# Patient Record
Sex: Male | Born: 1980 | Race: Black or African American | Hispanic: No | Marital: Married | Smoking: Never smoker
Health system: Southern US, Community
[De-identification: ages and names within clinical notes are randomized; demographics above are authoritative.]

## PROBLEM LIST (undated history)

## (undated) DIAGNOSIS — I10 Essential (primary) hypertension: Secondary | ICD-10-CM

## (undated) DIAGNOSIS — E119 Type 2 diabetes mellitus without complications: Secondary | ICD-10-CM

## (undated) DIAGNOSIS — Z01818 Encounter for other preprocedural examination: Secondary | ICD-10-CM

## (undated) HISTORY — PX: CHOLECYSTECTOMY: SHX55

---

## 2011-05-27 NOTE — ED Notes (Signed)
Charge nurse KS notified of Pt's BP.

## 2011-05-27 NOTE — ED Notes (Signed)
Migraine HA, N/V. Photophobia.

## 2011-05-27 NOTE — ED Provider Notes (Signed)
Patient is a 31 y.o. male presenting with migraines.   Migraine          No past medical history on file.     No past surgical history on file.      No family history on file.     History     Social History   ??? Marital Status: SINGLE     Spouse Name: N/A     Number of Children: N/A   ??? Years of Education: N/A     Occupational History   ??? Not on file.     Social History Main Topics   ??? Smoking status: Not on file   ??? Smokeless tobacco: Not on file   ??? Alcohol Use: Not on file   ??? Drug Use: Not on file   ??? Sexually Active: Not on file     Other Topics Concern   ??? Not on file     Social History Narrative   ??? No narrative on file                  ALLERGIES: Review of patient's allergies indicates no known allergies.      Review of Systems    Filed Vitals:    05/27/11 1637   BP: 192/122   Pulse: 72   Temp: 97.2 ??F (36.2 ??C)   Resp: 18   SpO2: 100%            Physical Exam     MDM    Procedures  Left without being seen

## 2013-09-29 NOTE — ED Provider Notes (Signed)
HPI Comments: 11:53 PM   33 y.o. male presents to ED complaining of epigastric pain, RUQ abdominal pain , and right-sided CVA back pain onset earlier today after eating a hamburger. Associated symptoms include nocturnal urinary frequency, nausea, and vomiting. PMHX includes HTN. Pt also reports hx of gallstones, last attack was 4 days ago where he vomited x2, went to an ED, had no imaging, was given fluids, morphine and Zofran, and was discharged with no prescriptions. Family hx includes diabetes. Patient denies fever, chills, diarrhea, dysuria, urgency, chest pain, SOB, difficulty breathing, diaphoresis, and any other symptoms or complaints       Patient is a 33 y.o. male presenting with back pain. The history is provided by the patient. No language interpreter was used.   Back Pain   This is a new problem. Episode onset: today. The problem has not changed since onset.The pain is at a severity of 10/10. Associated symptoms include abdominal pain (RUQ and epigastric). Pertinent negatives include no chest pain, no fever, no numbness, no headaches, no dysuria and no weakness. Risk factors: hx of gallstones.      Written by Einar Grad, ED Scribe, as dictated by Leamon Arnt, PA-C      Past Medical History   Diagnosis Date   ??? Gallstones    ??? Hypertension    ??? Kidney stones         History reviewed. No pertinent past surgical history.      History reviewed. No pertinent family history.     History     Social History   ??? Marital Status: SINGLE     Spouse Name: N/A     Number of Children: N/A   ??? Years of Education: N/A     Occupational History   ??? Not on file.     Social History Main Topics   ??? Smoking status: Never Smoker    ??? Smokeless tobacco: Not on file   ??? Alcohol Use: No   ??? Drug Use: Not on file   ??? Sexual Activity: Not on file     Other Topics Concern   ??? Not on file     Social History Narrative       ALLERGIES: Review of patient's allergies indicates no known allergies.      Review of Systems    Constitutional: Negative for fever, chills, activity change, appetite change and fatigue.   HENT: Negative for congestion, dental problem, ear pain, rhinorrhea, sinus pressure, sneezing, sore throat, trouble swallowing and voice change.    Eyes: Negative for pain, discharge, redness and itching.   Respiratory: Negative for cough, chest tightness, shortness of breath and wheezing.    Cardiovascular: Negative for chest pain and palpitations.   Gastrointestinal: Positive for vomiting and abdominal pain (RUQ and epigastric). Negative for nausea, diarrhea, constipation, blood in stool, abdominal distention and rectal pain.   Endocrine: Positive for polyuria.   Genitourinary: Positive for frequency (nocturnal). Negative for dysuria, hematuria, flank pain, discharge, penile pain and testicular pain.   Musculoskeletal: Positive for back pain (Right CVA region). Negative for joint swelling, arthralgias, neck pain and neck stiffness.   Skin: Negative for color change, rash and wound.   Allergic/Immunologic: Negative for immunocompromised state.   Neurological: Negative for dizziness, weakness, light-headedness, numbness and headaches.   Hematological: Negative for adenopathy.   Psychiatric/Behavioral: Negative for behavioral problems and agitation. The patient is not nervous/anxious.        Filed Vitals:    09/29/13 2345  09/30/13 0230 09/30/13 0439 09/30/13 0717   BP: 165/107 195/115 162/95 176/88   Pulse: 92 83 78 71   Temp: 98.1 ??F (36.7 ??C)      Resp: 18 16  18    Height: 6' (1.829 m)      Weight: 165.563 kg (365 lb)      SpO2: 98% 95% 98% 97%            Physical Exam   Constitutional: He is oriented to person, place, and time. He appears well-developed and well-nourished. No distress.   HENT:   Head: Normocephalic and atraumatic.   Right Ear: External ear normal.   Left Ear: External ear normal.   Nose: Nose normal.   Mouth/Throat: Oropharynx is clear and moist. No oropharyngeal exudate.    No rhinorrhea. Normal turbinates. No sinus TTP. Bilateral ear canals are clear and normal. Bilateral TM's are normal. Normal oropharynx, no erythema, or edema, tonsillar hypertrophy, or exudates.       Eyes: Conjunctivae and EOM are normal. Pupils are equal, round, and reactive to light. Right eye exhibits no discharge. Left eye exhibits no discharge.   Neck: Normal range of motion. Neck supple.   Cardiovascular: Normal rate, regular rhythm, normal heart sounds and intact distal pulses.  Exam reveals no friction rub.    No murmur heard.  Pulmonary/Chest: Effort normal and breath sounds normal. No respiratory distress. He has no wheezes. He has no rales. He exhibits no tenderness.   Abdominal: Soft. Bowel sounds are normal. He exhibits no distension and no mass. There is tenderness (RUQ, epigastric TTP, positive Murphy's sign, negative McBurney's point). There is CVA tenderness (right). There is no rebound and no guarding.   Musculoskeletal: Normal range of motion. He exhibits no edema or tenderness.   Lymphadenopathy:     He has no cervical adenopathy.   Neurological: He is alert and oriented to person, place, and time.   Skin: Skin is warm and dry. No rash noted. He is not diaphoretic.   Psychiatric: He has a normal mood and affect. His behavior is normal. Judgment and thought content normal.   Nursing note and vitals reviewed.       RESULTS:    EKG interpretation: (Preliminary)  12:30 AM   Nonspecific T wave abnormality, normal sinus rhythm, no STEMI, vent rate 85 bpm  EKG read by Leamon Arnt, PA-C     CT ABD PELV WO CONT   Final Result   IMPRESSION:  Limited assessment because of body habitus.  No hydronephrosis. No definite renal calculi though small calculi could be  present and not detected.  Heterogeneous gallbladder, gallstones or sludge could be present. Correlate with  right upper quadrant symptoms.  Hepatomegaly.        Labs Reviewed   CBC WITH AUTOMATED DIFF - Abnormal; Notable for the following:      RBC 4.32 (*)     HGB 12.9 (*)     All other components within normal limits   METABOLIC PANEL, COMPREHENSIVE - Abnormal; Notable for the following:     Potassium 3.4 (*)     CO2 34 (*)     Glucose 129 (*)     BUN/Creatinine ratio 10 (*)     ALT 143 (*)     AST 132 (*)     Albumin 3.3 (*)     All other components within normal limits   URINALYSIS W/ RFLX MICROSCOPIC - Abnormal; Notable for the following:     Protein  TRACE (*)     Urobilinogen 2.0 (*)     All other components within normal limits   LIPASE   URINE MICROSCOPIC ONLY   CARDIAC PANEL,(CK, CKMB & TROPONIN)       No results found for this or any previous visit (from the past 12 hour(s)).     MDM  Number of Diagnoses or Management Options  Diagnosis management comments: DDx: abd pain, epigastric pain, cholecystitis, cholelithiasis, choledochitis, pancreatitis, fatty liver, AMI, nausea, vomiting, pyelonephritis, cystitis, UTI.       Amount and/or Complexity of Data Reviewed  Clinical lab tests: ordered and reviewed (CBC, CMP, Lipase, Cardiac panel, Urinalysis)  Tests in the radiology section of CPT??: ordered and reviewed (CT abd pelv)  Tests in the medicine section of CPT??: ordered and reviewed (EKG)  Independent visualization of images, tracings, or specimens: yes (EKG)    Risk of Complications, Morbidity, and/or Mortality  Presenting problems: moderate  Diagnostic procedures: moderate  Management options: low    Patient Progress  Patient progress: stable       MEDICATIONS GIVEN:  Medications   ondansetron (ZOFRAN) injection 4 mg (4 mg IntraVENous Given 09/30/13 0018)   HYDROmorphone (PF) (DILAUDID) injection 1 mg (1 mg IntraVENous Given 09/30/13 0017)   0.9% sodium chloride infusion 1,000 mL (1,000 mL IntraVENous Given 09/30/13 0025)   HYDROmorphone (PF) (DILAUDID) injection 1 mg (1 mg IntraVENous Given 09/30/13 0228)   HYDROmorphone (PF) (DILAUDID) injection 1 mg (1 mg IntraVENous Given 09/30/13 0438)        Procedures     PROGRESS NOTE:  11:53 PM   Initial assessment performed.  Written by Einar Grad, ED Scribe, as dictated by Leamon Arnt, PA-C     PROGRESS NOTE:  2:53 AM   Patient care will be transferred to Josphat Musapatike, MD.  Discussed available diagnostic results and care plan at length.  Written by Einar Grad, as dictated by Leamon Arnt, PA-C .     CONSULT NOTE:   6:20 AM  Josphat Musapatike, MD spoke with Kathrynn Ducking, MD    Specialty: Surgery  Discussed pt's hx, disposition, and available diagnostic and imaging results. Reviewed care plans. Consulting physician agrees with plans as outlined. Dr. Carlynn Purl came by and gave him a card and he will see him tomorrow in the office or Wednesday.   Written by Conley Rolls, ED Scribe, as dictated by Glynn Octave, MD     PROGRESS NOTE:   7:01 AM  Pt has been re-examined by Buelah Manis, MD. Pt???s pain is resolved and will be discharged. Repeat exam shows no tenderness.   Written by Conley Rolls, ED Scribe, as dictated by Glynn Octave, MD .        DISCHARGE NOTE:  6:51 AM    Selmer Dominion  results have been reviewed with him.  He has been counseled regarding his diagnosis, treatment, and plan.  He verbally conveys understanding and agreement of the signs, symptoms, diagnosis, treatment and prognosis and additionally agrees to follow up as discussed.  He also agrees with the care-plan and conveys that all of his questions have been answered.  I have also provided discharge instructions for him that include: educational information regarding their diagnosis and treatment, and list of reasons why they would want to return to the ED prior to their follow-up appointment, should his condition change.    CLINICAL IMPRESSION:    1. Biliary colic    2. Cholelithiasis without cholecystitis  PLAN: DISCHARGE HOME    Follow-up Information    Follow up With Details Comments Contact Info    [facility] days  [address]  [place] [id number]   [place] [id number]  [phone]    [facility]  As needed, If symptoms worsen [address]  [place] [id number]  [phone]          Discharge Medication List as of 09/30/2013  7:06 AM      START taking these medications    Details   HYDROcodone-acetaminophen (NORCO) 7.5-325 mg per tablet Take 1 Tab by mouth every six (6) hours as needed for Pain. Max Daily Amount: 4 Tabs., Print, Disp-12 Tab, R-0         CONTINUE these medications which have NOT CHANGED    Details   lisinopril (PRINIVIL, ZESTRIL) 20 mg tablet Take 20 mg by mouth daily., Historical Med             Written by Einar Gradynthia A Ciccotelli, ED Scribe, as dictated by Leamon Arnteanna Krystl Wickware, PA-C .       I agree with the above documentation as written by the scribe.  Leamon Arnt-Iline Buchinger, PA-C

## 2013-09-29 NOTE — ED Notes (Signed)
Pt states " I have gallstones and i ate a burger earlier than i had a pain hit me in the back and around the right side to the middle of my stomach. I am so nauseated."

## 2013-09-30 LAB — CBC WITH AUTOMATED DIFF
ABS. BASOPHILS: 0 10*3/uL (ref 0.0–0.06)
ABS. EOSINOPHILS: 0.1 10*3/uL (ref 0.0–0.4)
ABS. LYMPHOCYTES: 2.4 10*3/uL (ref 0.9–3.6)
ABS. MONOCYTES: 0.4 10*3/uL (ref 0.05–1.2)
ABS. NEUTROPHILS: 3.6 10*3/uL (ref 1.8–8.0)
BASOPHILS: 0 % (ref 0–2)
EOSINOPHILS: 1 % (ref 0–5)
HCT: 38.9 % (ref 36.0–48.0)
HGB: 12.9 g/dL — ABNORMAL LOW (ref 13.0–16.0)
LYMPHOCYTES: 37 % (ref 21–52)
MCH: 29.9 PG (ref 24.0–34.0)
MCHC: 33.2 g/dL (ref 31.0–37.0)
MCV: 90 FL (ref 74.0–97.0)
MONOCYTES: 7 % (ref 3–10)
MPV: 10.2 FL (ref 9.2–11.8)
NEUTROPHILS: 55 % (ref 40–73)
PLATELET: 228 10*3/uL (ref 135–420)
RBC: 4.32 M/uL — ABNORMAL LOW (ref 4.70–5.50)
RDW: 14 % (ref 11.6–14.5)
WBC: 6.5 10*3/uL (ref 4.6–13.2)

## 2013-09-30 LAB — CARDIAC PANEL,(CK, CKMB & TROPONIN)
CK - MB: 1.5 ng/ml (ref 0.5–3.6)
CK-MB Index: 0.5 % (ref 0.0–4.0)
CK: 304 U/L (ref 39–308)
Troponin-I, QT: 0.02 NG/ML (ref 0.00–0.06)

## 2013-09-30 LAB — METABOLIC PANEL, COMPREHENSIVE
A-G Ratio: 0.8 (ref 0.8–1.7)
ALT (SGPT): 143 U/L — ABNORMAL HIGH (ref 16–61)
AST (SGOT): 132 U/L — ABNORMAL HIGH (ref 15–37)
Albumin: 3.3 g/dL — ABNORMAL LOW (ref 3.4–5.0)
Alk. phosphatase: 94 U/L (ref 45–117)
Anion gap: 5 mmol/L (ref 3.0–18)
BUN/Creatinine ratio: 10 — ABNORMAL LOW (ref 12–20)
BUN: 12 MG/DL (ref 7.0–18)
Bilirubin, total: 0.8 MG/DL (ref 0.2–1.0)
CO2: 34 mmol/L — ABNORMAL HIGH (ref 21–32)
Calcium: 8.6 MG/DL (ref 8.5–10.1)
Chloride: 104 mmol/L (ref 100–108)
Creatinine: 1.23 MG/DL (ref 0.6–1.3)
GFR est AA: >60 mL/min/{1.73_m2} (ref >60)
GFR est non-AA: >60 mL/min/{1.73_m2} (ref >60)
Globulin: 3.9 g/dL (ref 2.0–4.0)
Glucose: 129 mg/dL — ABNORMAL HIGH (ref 74–99)
Potassium: 3.4 mmol/L — ABNORMAL LOW (ref 3.5–5.5)
Protein, total: 7.2 g/dL (ref 6.4–8.2)
Sodium: 143 mmol/L (ref 136–145)

## 2013-09-30 LAB — URINE MICROSCOPIC ONLY
Bacteria: NEGATIVE /hpf
RBC: NEGATIVE /hpf (ref 0–5)
WBC: 1 /hpf (ref 0–5)

## 2013-09-30 LAB — URINALYSIS W/ RFLX MICROSCOPIC
Bilirubin: NEGATIVE
Blood: NEGATIVE
Glucose: NEGATIVE mg/dL
Ketone: NEGATIVE mg/dL
Leukocyte Esterase: NEGATIVE
Nitrites: NEGATIVE
Specific gravity: 1.015 (ref 1.003–1.030)
Urobilinogen: 2 EU/dL — ABNORMAL HIGH (ref 0.2–1.0)
pH (UA): 7 (ref 5.0–8.0)

## 2013-09-30 LAB — LIPASE: Lipase: 89 U/L (ref 73–393)

## 2013-09-30 MED ORDER — ONDANSETRON (PF) 4 MG/2 ML INJECTION
4 mg/2 mL | INTRAMUSCULAR | Status: AC
Start: 2013-09-30 — End: 2013-09-30
  Administered 2013-09-30: 04:00:00 via INTRAVENOUS

## 2013-09-30 MED ORDER — HYDROMORPHONE (PF) 1 MG/ML IJ SOLN
1 mg/mL | INTRAMUSCULAR | Status: AC
Start: 2013-09-30 — End: 2013-09-30
  Administered 2013-09-30: 04:00:00 via INTRAVENOUS

## 2013-09-30 MED ORDER — HYDROCODONE-ACETAMINOPHEN 7.5 MG-325 MG TAB
ORAL_TABLET | Freq: Four times a day (QID) | ORAL | Status: DC | PRN
Start: 2013-09-30 — End: 2014-02-23

## 2013-09-30 MED ORDER — SODIUM CHLORIDE 0.9 % IV
Freq: Once | INTRAVENOUS | Status: AC
Start: 2013-09-30 — End: 2013-09-30
  Administered 2013-09-30: 04:00:00 via INTRAVENOUS

## 2013-09-30 MED ORDER — HYDROMORPHONE (PF) 1 MG/ML IJ SOLN
1 mg/mL | INTRAMUSCULAR | Status: AC
Start: 2013-09-30 — End: 2013-09-30
  Administered 2013-09-30: 06:00:00 via INTRAVENOUS

## 2013-09-30 MED ORDER — HYDROMORPHONE (PF) 1 MG/ML IJ SOLN
1 mg/mL | Freq: Once | INTRAMUSCULAR | Status: AC
Start: 2013-09-30 — End: 2013-09-30
  Administered 2013-09-30: 09:00:00 via INTRAVENOUS

## 2013-09-30 MED FILL — HYDROMORPHONE (PF) 1 MG/ML IJ SOLN: 1 mg/mL | INTRAMUSCULAR | Qty: 1

## 2013-09-30 MED FILL — SODIUM CHLORIDE 0.9 % IV: INTRAVENOUS | Qty: 1000

## 2013-09-30 MED FILL — ONDANSETRON (PF) 4 MG/2 ML INJECTION: 4 mg/2 mL | INTRAMUSCULAR | Qty: 2

## 2013-09-30 NOTE — ED Notes (Signed)
Ian Rose was discharged in good and improved condition. The patient's diagnosis, condition and treatment were explained to patient and aftercare instructions were given. The patient verbalized good understanding. Patient armband removed and both labels and armband were placed in shred bin. Patient left ER ambulatory. 1 prescriptions provided. Patient provided education about pain medication including dosage and side effects. Patient verbalized understanding. Ride (wife) at bedside to provide transportation home.

## 2013-10-03 LAB — EKG, 12 LEAD, INITIAL
Atrial Rate: 85 {beats}/min
Calculated P Axis: 62 degrees
Calculated R Axis: 44 degrees
Calculated T Axis: 61 degrees
Diagnosis: NORMAL
P-R Interval: 170 ms
Q-T Interval: 356 ms
QRS Duration: 92 ms
QTC Calculation (Bezet): 423 ms
Ventricular Rate: 85 {beats}/min

## 2013-10-03 NOTE — ED Notes (Signed)
Patient armband removed and shreddedI have reviewed discharge instructions with the patient and spouse.  The patient and spouse verbalized understanding. rx x 2 given; pt to car via wc

## 2013-10-03 NOTE — ED Notes (Signed)
Pt states " I am still having the abdominal pain from the gallstones but i am feeling worse." I was here the other day but it is worse."

## 2013-10-03 NOTE — ED Provider Notes (Signed)
HPI Comments:   8:35 PM  33 y.o. male presents to the ED fiance and son C/O epigastric abd pain radiating to back onset 1 week ago. Hx gallstones onset 1 week ago. Pt has been seen at [facility] twice and here twice (including today). Pt states his pain stopped for while and then 4 days ago, he experienced sudden pain for which he presented to this ED. He was seen by Dr. Carlynn Purl, who recommended outpt follow up for elective cholecystectomy. His pain had been relieved at d/c. Pt called Dr. Arty Baumgartner office but states that the nurse mentioned charges for treatment that he could not afford.  2 days ago, pt was seen at [facility], given pain medication "that did not work", and he was d/c'ed home Yesterday, pt was in pain and could not hold down food or water. Pain exacerbates after drinking. Pt has been constipated and his bowel movements have not been normal. Hx HTN for which he take Lisinopril. Denies cough, SOB, or any other medical problems. Denies Hx of DM.    Patient is a 33 y.o. male presenting with abdominal pain. The history is provided by the patient. No language interpreter was used.   Abdominal Pain   This is a new problem. The current episode started more than 2 days ago (one week ago). The problem occurs constantly. The pain is located in the LUQ. Associated symptoms include nausea, vomiting, constipation (some) and back pain (left side). Pertinent negatives include no fever, no diarrhea, no hematochezia, no dysuria, no hematuria, no headaches, no arthralgias, no myalgias and no chest pain. Exacerbated by: drinking fluids. His past medical history is significant for gallstones.        Past Medical History   Diagnosis Date   ??? Gallstones    ??? Hypertension    ??? Kidney stones         History reviewed. No pertinent past surgical history.      History reviewed. No pertinent family history.     History     Social History   ??? Marital Status: SINGLE     Spouse Name: N/A     Number of Children: N/A    ??? Years of Education: N/A     Occupational History   ??? Not on file.     Social History Main Topics   ??? Smoking status: Never Smoker    ??? Smokeless tobacco: Not on file   ??? Alcohol Use: No   ??? Drug Use: Not on file   ??? Sexual Activity: Not on file     Other Topics Concern   ??? Not on file     Social History Narrative                  ALLERGIES: Review of patient's allergies indicates no known allergies.      Review of Systems   Constitutional: Negative for fever and fatigue.   HENT: Negative for rhinorrhea and sore throat.    Respiratory: Negative for cough and shortness of breath.    Cardiovascular: Negative for chest pain and palpitations.   Gastrointestinal: Positive for nausea, vomiting, abdominal pain and constipation (some). Negative for diarrhea and hematochezia.   Genitourinary: Negative for dysuria, hematuria and difficulty urinating.   Musculoskeletal: Positive for back pain (left side). Negative for myalgias and arthralgias.   Skin: Negative for color change and rash.   Neurological: Negative for light-headedness and headaches.   All other systems reviewed and are negative.  Filed Vitals:    10/03/13 2015 10/03/13 2244   BP: 186/102 182/100   Pulse: 97 87   Temp: 98.4 ??F (36.9 ??C)    Resp: 18 18   Height: 6' (1.829 m)    Weight: 161.481 kg (356 lb)    SpO2: 97% 98%            Physical Exam   Nursing note and vitals reviewed.   Vital signs and nursing notes reviewed.    CONSTITUTIONAL: Alert. Well-appearing; morbidly obese male, in no apparent distress.  HEAD: Normocephalic; atraumatic.  EYES: PERRL; Conjunctiva clear.   ENT:  Moist mucus membranes.         CV: Normal S1, S2; no murmurs, rubs, or gallops. No chest wall tenderness.  RESPIRATORY: Normal chest excursion with respiration; breath sounds clear and equal bilaterally; no wheezes, rhonchi, or rales.  GI: Normal bowel sounds; obese, nondistended, +TTP in LUQ, epigastric area and RUQ, neg murphys sign, entire lower abd nontender, no guarding or  rigidity. No CVA tenderness.   BACK:  No evidence of trauma or deformity. Non-tender to palpation.   EXT: Normal ROM in all four extremities; non-tender to palpation.  SKIN: Normal for age and race; warm; dry; good turgor; no apparent lesions or exudate.  NEURO: A & O x3.   PSYCH:  Mood and affect appropriate.       RESULTS:    RADIOLOGY FINDINGS  Abd X-ray shows NAP  Pending review by Radiologist  Recorded by Bailey Swaziland, ED Scribe, as dictated by Zebedee Iba, PA-C    EKG interpretation: (Preliminary)  Rate: 86 bpm; NSR, Nonspecific T wave abnormality.  EKG read by Zebedee Iba, PA-C at 9:28 PM.  Written by Bailey Swaziland, ED Scribe, as dictated by Zebedee Iba, PA-C.        XR ABD ACUTE W 1 V CHEST   Final Result           Labs Reviewed   CBC WITH AUTOMATED DIFF - Abnormal; Notable for the following:     RBC 4.47 (*)     All other components within normal limits   METABOLIC PANEL, COMPREHENSIVE - Abnormal; Notable for the following:     Potassium 3.2 (*)     Glucose 143 (*)     BUN/Creatinine ratio 10 (*)     ALT 103 (*)     Globulin 4.1 (*)     All other components within normal limits   LIPASE       No results found for this or any previous visit (from the past 12 hour(s)).    MDM  Number of Diagnoses or Management Options     Amount and/or Complexity of Data Reviewed  Clinical lab tests: ordered and reviewed  Tests in the radiology section of CPT??: ordered and reviewed (XR Abd)  Tests in the medicine section of CPT??: ordered and reviewed (EKG)  Decide to obtain previous medical records or to obtain history from someone other than the patient: yes ([facility])  Independent visualization of images, tracings, or specimens: yes (XR Abd  EKG)         MEDICATIONS GIVEN:  Medications   sodium chloride 0.9 % bolus infusion 1,000 mL (0 mL IntraVENous IV Completed 10/03/13 2236)   HYDROmorphone (PF) (DILAUDID) injection 1 mg (1 mg IntraVENous Given 10/03/13 2059)    ondansetron (ZOFRAN) injection 4 mg (4 mg IntraVENous Given 10/03/13 2058)   HYDROmorphone (PF) (DILAUDID) injection 1 mg (1 mg IntraVENous Given 10/03/13  2236)         Procedures     PROGRESS NOTE:   8:35 PM  Initial Assessment performed  Written by Bailey Swaziland, ED Scribe, as dictated by Zebedee Iba, PA-C.    CONSULT NOTE:   9:46 PM  Zebedee Iba, PA-C spoke with Venetia Maxon, DO   Specialty: ED Attending  Discussed pt's hx, disposition, and available diagnostic and imaging results. Reviewed care plans. Consulting physician agrees with plans as outlined.  Agrees with consulting general surgeon for further management..   Written by Bailey J Swaziland, ED Scribe, as dictated by Zebedee Iba, PA-C      CONSULT NOTE:   9:58 PM  Zebedee Iba, PA-C spoke with Kathrynn Ducking, MD   Specialty: General Surgery  Discussed pt's hx, disposition, and available diagnostic and imaging results. Reviewed care plans. Consulting physician agrees with plans as outlined. Dr. Carlynn Purl recommends discharging patient with pain medication and follow up in his office. Call tomorrow to be seen.  .   Written by Bailey J Swaziland, ED Scribe, as dictated by Zebedee Iba, PA-C        DISCHARGE NOTE:   10:05 PM  Selmer Dominion  results have been reviewed with him.  He has been counseled regarding his diagnosis, treatment, and plan.  He verbally conveys understanding and agreement of the signs, symptoms, diagnosis, treatment and prognosis and additionally agrees to follow up as discussed.  He also agrees with the care-plan and conveys that all of his questions have been answered.  I have also provided discharge instructions for him that include: educational information regarding their diagnosis and treatment, and list of reasons why they would want to return to the ED prior to their follow-up appointment, should his condition change.      CLINICAL IMPRESSION    1. Biliary colic     2. Calculus of gallbladder without cholecystitis without obstruction         Discharge Medication List as of 10/03/2013 10:04 PM      START taking these medications    Details   oxyCODONE-acetaminophen (PERCOCET) 5-325 mg per tablet Take 1 Tab by mouth every four (4) hours as needed for Pain. Max Daily Amount: 6 Tabs., Print, Disp-20 Tab, R-0      ondansetron (ZOFRAN ODT) 4 mg disintegrating tablet Take 1 Tab by mouth every eight (8) hours as needed for Nausea., Print, Disp-12 Tab, R-0         CONTINUE these medications which have NOT CHANGED    Details   lisinopril (PRINIVIL, ZESTRIL) 20 mg tablet Take 20 mg by mouth daily., Historical Med      HYDROcodone-acetaminophen (NORCO) 7.5-325 mg per tablet Take 1 Tab by mouth every six (6) hours as needed for Pain. Max Daily Amount: 4 Tabs., Print, Disp-12 Tab, R-0              Follow-up Information    Follow up With Details Comments Contact Info    Kathrynn Ducking, MD In 1 day Follow up tomorrow to be seen. [address]  [facility]  [address]  [place] [address]  [phone]      [facility] EMERGENCY DEPT  As needed, If symptoms worsen [address]  [place] [address]  [phone]           Written by Bailey Swaziland, ED Scribe, as dictated by Zebedee Iba, PA-C    I agree with the above documentation as written by the scribe.  -  Gweneth DimitriMarica J Betoney, PA

## 2013-10-04 LAB — CBC WITH AUTOMATED DIFF
ABS. BASOPHILS: 0 10*3/uL (ref 0.0–0.06)
ABS. EOSINOPHILS: 0.1 10*3/uL (ref 0.0–0.4)
ABS. LYMPHOCYTES: 2.7 10*3/uL (ref 0.9–3.6)
ABS. MONOCYTES: 0.5 10*3/uL (ref 0.05–1.2)
ABS. NEUTROPHILS: 3.8 10*3/uL (ref 1.8–8.0)
BASOPHILS: 0 % (ref 0–2)
EOSINOPHILS: 1 % (ref 0–5)
HCT: 39.8 % (ref 36.0–48.0)
HGB: 13.5 g/dL (ref 13.0–16.0)
LYMPHOCYTES: 39 % (ref 21–52)
MCH: 30.2 PG (ref 24.0–34.0)
MCHC: 33.9 g/dL (ref 31.0–37.0)
MCV: 89 FL (ref 74.0–97.0)
MONOCYTES: 7 % (ref 3–10)
MPV: 10.4 FL (ref 9.2–11.8)
NEUTROPHILS: 53 % (ref 40–73)
PLATELET: 261 10*3/uL (ref 135–420)
RBC: 4.47 M/uL — ABNORMAL LOW (ref 4.70–5.50)
RDW: 13.8 % (ref 11.6–14.5)
WBC: 7.1 10*3/uL (ref 4.6–13.2)

## 2013-10-04 LAB — METABOLIC PANEL, COMPREHENSIVE
A-G Ratio: 0.8 (ref 0.8–1.7)
ALT (SGPT): 103 U/L — ABNORMAL HIGH (ref 16–61)
AST (SGOT): 30 U/L (ref 15–37)
Albumin: 3.4 g/dL (ref 3.4–5.0)
Alk. phosphatase: 109 U/L (ref 45–117)
Anion gap: 7 mmol/L (ref 3.0–18)
BUN/Creatinine ratio: 10 — ABNORMAL LOW (ref 12–20)
BUN: 12 MG/DL (ref 7.0–18)
Bilirubin, total: 0.5 MG/DL (ref 0.2–1.0)
CO2: 31 mmol/L (ref 21–32)
Calcium: 8.8 MG/DL (ref 8.5–10.1)
Chloride: 105 mmol/L (ref 100–108)
Creatinine: 1.19 MG/DL (ref 0.6–1.3)
GFR est AA: >60 mL/min/{1.73_m2} (ref >60)
GFR est non-AA: >60 mL/min/{1.73_m2} (ref >60)
Globulin: 4.1 g/dL — ABNORMAL HIGH (ref 2.0–4.0)
Glucose: 143 mg/dL — ABNORMAL HIGH (ref 74–99)
Potassium: 3.2 mmol/L — ABNORMAL LOW (ref 3.5–5.5)
Protein, total: 7.5 g/dL (ref 6.4–8.2)
Sodium: 143 mmol/L (ref 136–145)

## 2013-10-04 LAB — LIPASE: Lipase: 105 U/L (ref 73–393)

## 2013-10-04 MED ORDER — ONDANSETRON (PF) 4 MG/2 ML INJECTION
4 mg/2 mL | INTRAMUSCULAR | Status: AC
Start: 2013-10-04 — End: 2013-10-03
  Administered 2013-10-04: 01:00:00 via INTRAVENOUS

## 2013-10-04 MED ORDER — ONDANSETRON 4 MG TAB, RAPID DISSOLVE
4 mg | ORAL_TABLET | Freq: Three times a day (TID) | ORAL | Status: DC | PRN
Start: 2013-10-04 — End: 2014-02-23

## 2013-10-04 MED ORDER — OXYCODONE-ACETAMINOPHEN 5 MG-325 MG TAB
5-325 mg | ORAL_TABLET | ORAL | Status: DC | PRN
Start: 2013-10-04 — End: 2014-02-23

## 2013-10-04 MED ORDER — HYDROMORPHONE (PF) 1 MG/ML IJ SOLN
1 mg/mL | INTRAMUSCULAR | Status: AC
Start: 2013-10-04 — End: 2013-10-03
  Administered 2013-10-04: 03:00:00 via INTRAVENOUS

## 2013-10-04 MED ORDER — HYDROMORPHONE (PF) 1 MG/ML IJ SOLN
1 mg/mL | INTRAMUSCULAR | Status: AC
Start: 2013-10-04 — End: 2013-10-03
  Administered 2013-10-04: 01:00:00 via INTRAVENOUS

## 2013-10-04 MED ORDER — SODIUM CHLORIDE 0.9% BOLUS IV
0.9 % | Freq: Once | INTRAVENOUS | Status: AC
Start: 2013-10-04 — End: 2013-10-03
  Administered 2013-10-04: 01:00:00 via INTRAVENOUS

## 2013-10-04 MED FILL — SODIUM CHLORIDE 0.9 % IV: INTRAVENOUS | Qty: 1000

## 2013-10-04 MED FILL — HYDROMORPHONE (PF) 1 MG/ML IJ SOLN: 1 mg/mL | INTRAMUSCULAR | Qty: 1

## 2013-10-04 MED FILL — ONDANSETRON (PF) 4 MG/2 ML INJECTION: 4 mg/2 mL | INTRAMUSCULAR | Qty: 2

## 2013-10-06 LAB — EKG, 12 LEAD, INITIAL
Atrial Rate: 86 {beats}/min
Calculated P Axis: 63 degrees
Calculated R Axis: 43 degrees
Calculated T Axis: 52 degrees
Diagnosis: NORMAL
P-R Interval: 156 ms
Q-T Interval: 360 ms
QRS Duration: 92 ms
QTC Calculation (Bezet): 430 ms
Ventricular Rate: 86 {beats}/min

## 2013-10-21 NOTE — Progress Notes (Signed)
Ian MealsAnthony R Rose participated in an educational session on the importance of starting to make healthy choices prior to weight loss surgery. General healthy foods were reviewed. Diet history was reviewed. Patient set a dietary, exercise, and behavioral goal in order to start making healthy changes now.     Visit Vitals   Item Reading   ??? Ht 6' (1.829 m)   ??? Wt 203.211 kg (448 lb)   ??? BMI 60.75 kg/m2     Garret ReddishLEAH BEIERMANN, RD

## 2014-02-23 ENCOUNTER — Inpatient Hospital Stay
Admit: 2014-02-23 | Discharge: 2014-02-23 | Disposition: A | Discharge status: Home / Self Care | Payer: Self-pay | Attending: Emergency Medicine

## 2014-02-23 DIAGNOSIS — R1013 Epigastric pain: Secondary | ICD-10-CM

## 2014-02-23 LAB — CBC WITH AUTOMATED DIFF
ABS. BASOPHILS: 0.1 10*3/uL — ABNORMAL HIGH (ref 0.0–0.06)
ABS. EOSINOPHILS: 0.2 10*3/uL (ref 0.0–0.4)
ABS. LYMPHOCYTES: 2.1 10*3/uL (ref 0.9–3.6)
ABS. MONOCYTES: 0.5 10*3/uL (ref 0.05–1.2)
ABS. NEUTROPHILS: 3.7 10*3/uL (ref 1.8–8.0)
BASOPHILS: 1 % (ref 0–2)
EOSINOPHILS: 3 % (ref 0–5)
HCT: 38.1 % (ref 36.0–48.0)
HGB: 12.1 g/dL — ABNORMAL LOW (ref 13.0–16.0)
LYMPHOCYTES: 32 % (ref 21–52)
MCH: 29.3 PG (ref 24.0–34.0)
MCHC: 31.8 g/dL (ref 31.0–37.0)
MCV: 92.3 FL (ref 74.0–97.0)
MONOCYTES: 8 % (ref 3–10)
MPV: 11.4 FL (ref 9.2–11.8)
NEUTROPHILS: 56 % (ref 40–73)
PLATELET: 333 10*3/uL (ref 135–420)
RBC: 4.13 M/uL — ABNORMAL LOW (ref 4.70–5.50)
RDW: 13.6 % (ref 11.6–14.5)
WBC: 6.7 10*3/uL (ref 4.6–13.2)

## 2014-02-23 LAB — METABOLIC PANEL, COMPREHENSIVE
A-G Ratio: 0.7 — ABNORMAL LOW (ref 0.8–1.7)
ALT (SGPT): 95 U/L — ABNORMAL HIGH (ref 16–61)
AST (SGOT): 55 U/L — ABNORMAL HIGH (ref 15–37)
Albumin: 3.4 g/dL (ref 3.4–5.0)
Alk. phosphatase: 167 U/L — ABNORMAL HIGH (ref 45–117)
Anion gap: 5 mmol/L (ref 3.0–18)
BUN/Creatinine ratio: 11 — ABNORMAL LOW (ref 12–20)
BUN: 14 MG/DL (ref 7.0–18)
Bilirubin, total: 0.8 MG/DL (ref 0.2–1.0)
CO2: 32 mmol/L (ref 21–32)
Calcium: 9.2 MG/DL (ref 8.5–10.1)
Chloride: 107 mmol/L (ref 100–108)
Creatinine: 1.27 MG/DL (ref 0.6–1.3)
GFR est AA: >60 mL/min/{1.73_m2} (ref >60)
GFR est non-AA: >60 mL/min/{1.73_m2} (ref >60)
Globulin: 4.7 g/dL — ABNORMAL HIGH (ref 2.0–4.0)
Glucose: 121 mg/dL — ABNORMAL HIGH (ref 74–99)
Potassium: 3.7 mmol/L (ref 3.5–5.5)
Protein, total: 8.1 g/dL (ref 6.4–8.2)
Sodium: 144 mmol/L (ref 136–145)

## 2014-02-23 LAB — LIPASE: Lipase: 194 U/L (ref 73–393)

## 2014-02-23 MED ORDER — DOCUSATE SODIUM 50 MG CAP
50 mg | ORAL_CAPSULE | Freq: Two times a day (BID) | ORAL | Status: AC
Start: 2014-02-23 — End: 2014-03-02

## 2014-02-23 MED ORDER — HYDROMORPHONE (PF) 1 MG/ML IJ SOLN
1 mg/mL | INTRAMUSCULAR | Status: DC
Start: 2014-02-23 — End: 2014-02-23

## 2014-02-23 MED ORDER — HYDROMORPHONE (PF) 1 MG/ML IJ SOLN
1 mg/mL | INTRAMUSCULAR | Status: AC
Start: 2014-02-23 — End: 2014-02-23
  Administered 2014-02-23: 21:00:00 via INTRAVENOUS

## 2014-02-23 MED ORDER — MORPHINE 4 MG/ML SYRINGE
4 mg/mL | INTRAMUSCULAR | Status: DC
Start: 2014-02-23 — End: 2014-02-23

## 2014-02-23 MED ORDER — HYDROMORPHONE 0.5 MG/0.5 ML SYRINGE
0.5 mg/ mL | Freq: Once | INTRAMUSCULAR | Status: AC
Start: 2014-02-23 — End: 2014-02-23
  Administered 2014-02-23: 19:00:00 via INTRAVENOUS

## 2014-02-23 MED ORDER — OXYCODONE-ACETAMINOPHEN 5 MG-325 MG TAB
5-325 mg | ORAL_TABLET | Freq: Four times a day (QID) | ORAL | Status: AC | PRN
Start: 2014-02-23 — End: ?

## 2014-02-23 MED FILL — HYDROMORPHONE (PF) 1 MG/ML IJ SOLN: 1 mg/mL | INTRAMUSCULAR | Qty: 2

## 2014-02-23 MED FILL — HYDROMORPHONE 0.5 MG/0.5 ML SYRINGE: 0.5 mg/ mL | INTRAMUSCULAR | Qty: 0.5

## 2014-02-23 NOTE — ED Provider Notes (Signed)
HPI Comments: Ian Rose is a 33 yo WM who presents to the ED c/o epigastric abdominal pain that has been persistent since having biliary drainage catheter placed on 02/07/14 after having cholecystectomy 2 days prior.  Pt states the pain has been worsening over the past few days, and he went to [facility]14, at which time he had CT scan and lab studies.  Pt states he last took Percocet yesterday without relief.  He reports pain is 7/10 at time now.  He describes the pain as sharp and says that it sometimes resolves when he rubs on the epigastric area.  No other aggravating or alleviating factors.  He denies any associated fever, nausea/vomiting, dysuria, chest pain or shortness of breath.  He reports last BM was last night but hard to pass.  He has intermittent associated sweating.      Patient is a 33 y.o. male presenting with abdominal pain.   Abdominal Pain   Pertinent negatives include no fever, no nausea, no vomiting, no dysuria and no chest pain.        Past Medical History:   Diagnosis Date   ??? Gallstones    ??? Hypertension    ??? Kidney stones    ??? Morbid obesity (HCC) 10/21/2013   ??? Body mass index 60.0-69.9, adult (HCC) 10/21/2013       Past Surgical History:   Procedure Laterality Date   ??? Hx cholecystectomy           History reviewed. No pertinent family history.    History     Social History   ??? Marital Status: SINGLE     Spouse Name: N/A     Number of Children: N/A   ??? Years of Education: N/A     Occupational History   ??? Not on file.     Social History Main Topics   ??? Smoking status: Never Smoker    ??? Smokeless tobacco: Not on file   ??? Alcohol Use: No   ??? Drug Use: No   ??? Sexual Activity: Not on file     Other Topics Concern   ??? Not on file     Social History Narrative                ALLERGIES: Review of patient's allergies indicates no known allergies.      Review of Systems   Constitutional: Negative for fever.   Respiratory: Negative for shortness of breath.     Cardiovascular: Negative for chest pain and leg swelling.   Gastrointestinal: Positive for abdominal pain. Negative for nausea and vomiting.   Genitourinary: Negative for dysuria.   Skin: Negative.        Filed Vitals:    02/23/14 1325   BP: 170/110   Pulse: 91   Temp: 99.1 ??F (37.3 ??C)   Resp: 18   Height: 5\' 11"  (1.803 m)   Weight: 202.304 kg (446 lb)   SpO2: 99%            Physical Exam   Constitutional: He appears well-developed and well-nourished.   Eyes: Conjunctivae are normal.   Cardiovascular: Normal rate and regular rhythm.    Pulmonary/Chest: Effort normal and breath sounds normal.   Abdominal: Soft. Bowel sounds are normal.   Diffuse mild tenderness across upper abdomen without rebound.   Musculoskeletal: He exhibits no edema.   Neurological: He is alert.   Skin: Skin is warm and dry.   Psychiatric: He has a normal mood  and affect.        MDM   Pt with abdominal pain that has been persistent since biliary drain placed on 02/07/14 that has been worse over the past few days.  Pt with epigastric pain, states sometimes better after he rubs the area.  Labs ordered, no significant change in LFT's since [facility].  Do not feel that repeat imaging is necessary at this time.  Discussed care with Dr. Maurine SimmeringWentzel, who agrees with testing ordered and no need for additional testing.  Pt states pain is improved, requesting prescription for pain medication.  He [facility], including with his surgeon.  Pt and family agreeable to plan.    Results from [facility] from 02/19/14:  CT ABD/PELVIS-NO ORAL/IV (02/19/2014 3:11 AM EST)  CT ABD/PELVIS-NO ORAL/IV (02/19/2014 3:11 AM EST)   Impressions   IMPRESSION:   1. Surgical changes of cholecystectomy and placement of an internal-external biliary drainage catheter which is present in expected position. There is no evidence of intrahepatic or extrahepatic biliary ductal dilatation present.    2. No bowel obstruction or perforation. Normal appendix.   3. Small hiatal hernia.      CT ABD/PELVIS-NO ORAL/IV (02/19/2014 3:11 AM EST)   Narrative   EXAM: CT of the abdomen and pelvis   INDICATION: 33 year old male status post cholecystectomy and percutaneous biliary drainage catheter placement 02/07/2014 for common bile duct stone presenting with abdominal pain.   COMPARISON: 02/04/2014   TECHNIQUE: Axial CT imaging of the abdomen and pelvis was performed without intravenous contrast. Multiplanar reformats were generated.   _______________   FINDINGS:   LOWER CHEST: Images through the lower chest demonstrates subsegmental atelectasis without focal consolidation, pneumothorax, or pleural effusion present. The cardiac size is stable. There is a small amount of pericardial fluid present.   LIVER, BILIARY: There surgical changes from cholecystectomy present with surgical clips present within the right upper quadrant. There is an internal-external biliary drainage catheter which enters the right hepatic lobe, descends through the common bile duct, with the distal end looped in the second portion of the duodenum. There is no intrahepatic biliary ductal dilatation identified.   PANCREAS: The unenhanced appearance of the pancreas is normal.   SPLEEN: Normal.   ADRENALS: Normal.   KIDNEYS: No hydronephrosis is identified in either kidney. No definite nephrolithiasis.   LYMPH NODES: There are scattered subcentimeter mesenteric lymph nodes present without adenopathy noted.   GASTROINTESTINAL TRACT: The small intestine, and large intestine are normal in course and caliber. There is no evidence of bowel obstruction or perforation. The appendix is normal. A small hiatal hernia is present.   PELVIC ORGANS: Unremarkable.   VASCULATURE: The unenhanced appearance of the aorta demonstrates no aneurysmal dilatation.   BONES: No acute or aggressive osseous abnormalities identified.   OTHER: None.      HEPATIC FUNCTION PANEL (02/19/2014 3:16 AM EST)  Only the most recent of 9 results within the time period is included.   HEPATIC FUNCTION PANEL (02/19/2014 3:16 AM EST)   Component Value Range   ALBUMIN 3.9 3.5-5.0 g/dL   TOTAL PROTEIN 8.6 (H) 6.4-8.3 g/dL   GLOBULIN SERUM 4.7 (H) 2.0-4.0 g/dL   A/G RATIO 0.8 (L) 1.6-1.01.1-2.6 ratio   BILIRUBIN TOTAL 1.0 0.2-1.2 mg/dL   BILIRUBIN DIRECT 0.5 (H) 0.0-0.3 mg/dL   SGOT (AST) 54 (H) 96-0410-37 U/L   ALKALINE PHOSPHATASE 197 (H) 25-115 U/L   SGPT (ALT) 94 (H) 5-40 U/L  Procedures

## 2014-02-23 NOTE — ED Notes (Signed)
Pt states epigastric abdominal pain. Pt refused morphine states, "morphine doesn't help with my pain, they usually give me dilaudid." Pt reports morphine causes burning sensation to the epigastric region of the abdomen. Provider made aware.

## 2014-02-23 NOTE — ED Notes (Signed)
Pt states ready for discharge. Pt states he will follow up with PCP as instructed by provider. Pt appears in NOAD.    I have reviewed discharge instructions with the patient. Prescriptions were reviewed with patient instructed not to drink alcohol, drive a car, or operate heavy machinery while taking this medicine. The patient verbalized understanding. Patient seen leaving ED ambulatory without difficulty or need for assistance, with S/O in no sign of distress. Patient armband removed and shredded    Current Discharge Medication List      START taking these medications    Details   docusate sodium (COLACE) 50 mg capsule Take 1 Cap by mouth two (2) times a day for 7 days.  Qty: 14 Cap, Refills: 0         CONTINUE these medications which have CHANGED    Details   oxyCODONE-acetaminophen (PERCOCET) 5-325 mg per tablet Take 1 Tab by mouth every six (6) hours as needed for Pain. Max Daily Amount: 4 Tabs.  Qty: 10 Tab, Refills: 0         CONTINUE these medications which have NOT CHANGED    Details   lidocaine (LIDODERM) 5 %(700 mg/patch) by TransDERmal route every twenty-four (24) hours. Apply patch to the affected area for 12 hours a day and remove for 12 hours a day.      amLODIPine (NORVASC) 10 mg tablet Take 10 mg by mouth daily.      lisinopril (PRINIVIL, ZESTRIL) 40 mg tablet Take 40 mg by mouth daily.

## 2014-02-23 NOTE — ED Notes (Addendum)
Patient states hx of "stomach" pain.  Patient states onset of epigastric pain last night.  Denies pain at present.  States pain is intermittent in nature.  States "breaking out in sweats" with pain.  C/o constipation. States last bowel movement was last night and was constipated.

## 2020-04-06 ENCOUNTER — Ambulatory Visit: Attending: Specialist | Primary: Internal Medicine

## 2020-04-06 ENCOUNTER — Ambulatory Visit: Admit: 2020-04-06 | Discharge: 2020-04-06 | Payer: MEDICAID | Attending: Specialist | Primary: Internal Medicine

## 2020-04-06 NOTE — Progress Notes (Signed)
Bariatric Surgery Consultation    Subjective:     The patient is a 40 y.o. obese male with a Body mass index is 72.28 kg/m??..  The patient is currently his heaviest weight for the past several years.  he has been overweight since childhood.   he has been considering surgery since last year.  he desires surgery at this time because of multiple health concerns and their lifestyle issues which are hindered by their weight. he has been referred by Dr. Izora Gala for evaluation and treatment of their obesity via surgical intervention. Ian Rose has tried multiple diets in his lifetime most recently tried behavior modification and unsupervised diets    Bariatric comorbidities present are   Patient Active Problem List   Diagnosis Code   ??? Morbid obesity (HCC) E66.01   ??? Body mass index 60.0-69.9, adult (HCC) Z68.44   ??? Hypertension I10   ??? Diabetes mellitus (HCC) E11.9   ??? Diastolic heart failure (HCC) I50.30   ??? Cardiomyopathy (HCC) I42.9   ??? Asthma J45.909   ??? Morbid obesity with BMI of 70 and over, adult (HCC) E66.01, Z68.45   ??? Functional dyspepsia K30   ??? Sleep disorder breathing G47.30   ??? Arthritis M19.90   ??? Hx of congestive heart disease Z86.79       The patient is considering laparoscopic sleeve gastrectomy for surgical weight loss due to their ineffective progress with medical forms of weight loss and the urging of their physician who cares for their primary medical issues. The patient  now presents  for consideration for weight loss surgery understanding the benefits of this over a medical approach of weight loss as was discussed in our presentation on weight loss surgery. They have discussed their plans both with their family and primary care physician who is in support of their pursuit of such. The patient has had no health issues as of late and denies and gastrointestinal disturbances other than what is outlined below in their review of symptoms. All of their prior evaluations available by both their  PCP's and specialists physicians have been reviewed today either in the Care Everywhere portal or scanned under the media tab.    I have spent a large portion of my initial consultation today reviewing the patients current dietary habits which have contributed to their health issues and obesity.  I have suggested to them personally a dietary regimen that they can initiate now to help with their status as it pertains to their weight.  They understand that the most important aspect of their journey through their weight loss endeavor will be their adherence to a new lifestyle of healthy eating behavior. They also understand that an adherence to an exercise program will not only help with weight loss but is ultimately important in weight maintenance.    The patients goal weight is 190 lb.     Patient Active Problem List    Diagnosis Date Noted   ??? Hypertension    ??? Diabetes mellitus (HCC)    ??? Diastolic heart failure (HCC)    ??? Cardiomyopathy (HCC)    ??? Asthma    ??? Morbid obesity with BMI of 70 and over, adult [facility])    ??? Functional dyspepsia    ??? Sleep disorder breathing    ??? Arthritis    ??? Hx of congestive heart disease    ??? Morbid obesity (HCC) 10/21/2013   ??? Body mass index 60.0-69.9, adult (HCC) 10/21/2013     Past Surgical  History:   Procedure Laterality Date   ??? HX CHOLECYSTECTOMY        Social History     Tobacco Use   ??? Smoking status: Never Smoker   ??? Smokeless tobacco: Never Used   Substance Use Topics   ??? Alcohol use: No      History reviewed. No pertinent family history.   Current Outpatient Medications   Medication Sig Dispense Refill   ??? furosemide (LASIX) 40 mg tablet TAKE 1 TABLET BY MOUTH ONCE DAILY AS NEEDED FOR EDEMA     ??? carvediloL (COREG) 12.5 mg tablet Take 12.5 mg by mouth two (2) times a day.     ??? insulin NPH/insulin regular (NovoLIN 70/30 U-100 Insulin) 100 unit/mL (70-30) injection 100 Units by SubCUTAneous route two (2) times a day.     ??? Atrovent HFA 17 mcg/actuation inhaler INHALE 2 PUFFS  BY MOUTH 4 TIMES DAILY     ??? lidocaine (LIDODERM) 5 %(700 mg/patch) by TransDERmal route every twenty-four (24) hours. Apply patch to the affected area for 12 hours a day and remove for 12 hours a day.     ??? lisinopril (PRINIVIL, ZESTRIL) 40 mg tablet Take 40 mg by mouth daily.     ??? amLODIPine (NORVASC) 10 mg tablet Take 10 mg by mouth daily. (Patient not taking: Reported on 04/06/2020)     ??? oxyCODONE-acetaminophen (PERCOCET) 5-325 mg per tablet Take 1 Tab by mouth every six (6) hours as needed for Pain. Max Daily Amount: 4 Tabs. (Patient not taking: Reported on 04/06/2020) 10 Tab 0     Allergies   Allergen Reactions   ??? Shellfish Derived Anaphylaxis   ??? Iodine Hives and Itching        Review of Systems:     General - No history or complaints of unexpected fever, chills, or weight loss  Head/Neck - No history or complaints of headache, diplopia, dysphagia, hearing loss  Cardiac - No history or complaints of chest pain, palpitations, murmur, or shortness of breath  Pulmonary - No history or complaints of shortness of breath, productive cough, hemoptysis  Gastrointestinal - (+) reflux, no abdominal pain, obstipation/constipation or blood per rectum  Genitourinary - No history or complaints of hematuria/dysuria, stress urinary incontinence symptoms, or renal lithiasis  Musculoskeletal - moderate joint pain in multiple joints,  no muscular weakness  Hematologic - No history or complaints of bleeding disorders,  No blood transfusions  Neurologic - No history or complaints of  migraine headaches, seizure activity, syncopal episodes, TIA or stroke  Integumentary - No history or complaints of rashes, abnormal nevi, skin cancer    Objective:     Visit Vitals  BP (!) 176/98 (BP 1 Location: Left upper arm, BP Patient Position: Sitting)   Pulse 84   Temp 98.2 ??F (36.8 ??C) (Temporal)   Resp 20   Ht 5' 10.5" (1.791 m)   Wt (!) 231.8 kg (511 lb)   SpO2 99%   BMI 72.28 kg/m??     Physical Examination: General appearance - alert,  well appearing, and in no distress  Mental status - alert, oriented to person, place, and time  Eyes - pupils equal and reactive, extraocular eye movements intact  Nose - normal and patent, no erythema, discharge or polyps  Mouth - mucous membranes moist, pharynx normal without lesions  Neck - supple, no significant adenopathy  Lymphatics - no palpable lymphadenopathy, no hepatosplenomegaly  Chest - clear to auscultation, no wheezes, rales or rhonchi, symmetric air  entry  Heart - normal rate, regular rhythm, normal S1, S2, no murmurs, rubs, clicks or gallops  Abdomen - soft, nontender, nondistended, no masses or organomegaly,   massive central obesity with narrow costal margin  Back exam - full range of motion, no tenderness, palpable spasm or pain on motion  Neurological - alert, oriented, normal speech, no focal findings or movement disorder noted  Musculoskeletal - no joint tenderness, deformity or swelling  Extremities - peripheral pulses normal, no pedal edema, no clubbing or cyanosis  Skin - normal coloration and turgor, no rashes, no suspicious skin lesions noted    Labs:       No results found for this or any previous visit (from the past 1440 hour(s)).    Assessment:     Morbid obesity with comorbidity    Plan:     laparoscopic sleeve gastrectomy    This is a 40 y.o. male with a BMI of Body mass index is 72.28 kg/m??. and the weight-related co-morbidties as noted below. Ian Rose meets the NIH criteria for bariatric surgery based upon the BMI of Body mass index is 72.28 kg/m??. and multiple weight-related co-morbidties. Ian Rose has elected laparoscopic sleeve gastrectomy as his intervention of choice for treatment of morbid obestiy through surgical means secondary to its safety profile, rapid return to work  and decreases in operative risks over gastric bypass.    In the office today, following Kenedi Cilia's history and physical examination, a 30 minute discussion regarding the anatomic alterations for the  laparoscopic sleeve gastrectomy was undertaken. The dietary expectations and the patient and physician dependent factors for success were thoroughly discussed, to include the need for interval follow-up and long-term dietary changes associated with success. The possible complications of the sleeve gastrectomy  were also discussed, to include;death, DVT/PE, staple line leak, bleeding, stricture formation, infection, nutritional deficiencies and sleeve dilation.  Specific weight related outcomes for success were also discussed with an emphasis on careful and close follow-up with the first year and eating behavior modification as the baseline and cyclical hunger return.  The patient expressed an understanding of the above factors, and his questions were answered in their entirety.    In addition, the patient attended a 1.5 hour power point seminar regarding obesity, surgical weight loss including, adjustable gastric band, gastric bypass, and sleeve gastrectomy.  This discussion contrasted the different surgical techniques, mechanisms of actions and expected outcomes, and surgical and medical risks associated with each procedure.  During this seminar, there was a long question and answer session where each questions was answered until there were no additional questions.     Today, the patient had all of his questions answered and desires to proceed with  bariatric surgery initially choosing sleeve gastrectomy as his surgical option.    Sleep study and labs ordered today     Echo from Jan 11th 2022    Narrative  Performed by [facility]  CONCLUSIONS   ????* Complete transthoracic echocardiogram performed with 2D imaging, color   Doppler, and spectral Doppler.   ????* Ultrasound enhancing agent is used with little improvement to endocardial   borders.   ????* Technically difficult study with very poor visualization of endocardial   borders.   ????* Left ventricular systolic function is mildly reduced with an ejection   fraction of  40 % by visual estimation.   ????* Left ventricular chamber size is enlarged by visual estimation..   ????* There is concentric left ventricular hypertrophy with a mildly thickened   septal  wall and mildly thickened posterior wall.   ????* There is global hypokinesis.   ????* Left ventricular diastolic function: indeterminate.   ????* Right ventricular systolic function is normal with TAPSE measuring 2.14   cm. TAPSV measuring 6.26 cm/s.   ????* No pulmonary hypertension, estimated pulmonary arterial systolic pressure   is 13 mmHg.   ????* No hemodynamically significant valvular disease.   ????* No mass, shunts, or thrombi.     Comparison   ????* Compared??to prior study from 09/27/2018. Increased left ventricular cavity   size with severe concentric left ventricular hypertrophy, EF was 35%, dilated   left atrium, PASP 14 mmHg, small pericardial effusion.       Secondary Diagnoses:     Dietary Intervention  - The patient is currently scheduled to see or has been followed by a bariatric nutritionist for an attempt at preoperative weight loss as has been dictated by their insurance carrier.  They will be assessed at various times during their follow up to evaluate their progress depending on the length of time that is required once again by their carrier.  I have explained the importance of preoperative weight loss and the benefits regarding lower surgical risk and also assisting the patient in reaching their weight loss goal.  Finally they understand their is a physiologic benefit from the standpoint of hepatic volume reduction preoperatively.  I have reiterated the importance of a low carbohydrate and high protein regimen to achieve their stated goal.  He has a weight loss goal of 30+ lbs.     GERD -The patient understands that weight loss surgery is not a guaranteed cure for reflux disease but does understand the benefits that weight loss can have on reflux disease.  They also understand that at the time of surgery the  gastroesophageal junction will be evaluated for the presence of a diaphragmatic hernia.  Hernias will be corrected always with the gastric band and sleeve gastrectomy procedures, but only on a case by case basis with the gastric bypass if it prevents our ability to perform the operation at hand, or if I feel that they would benefit long term with correction of this issue.  The patient also understands that neither weight loss surgery nor repair of a diaphragmatic hernia repair guarantees the complete cessation of the disease.They also understand there is a possibility of recurrence with a simple crural repair as is performed with these procedures. They understand they may have to continue their medications in the postoperative period. They have a good understanding that the gastric bypass procedure is better suited to total resolution of this issue and that neither the Lap Band nor sleeve gastrectomy is considered a curative procedure as it pertains to this diagnosis.    Hypertension - The patient has a clear understanding of how weight loss improves hypertension as a whole, but also they understand that there is a significant genetic component to this disease process. We will monitor the patient???s blood pressure while in the hospital and the plan would be to continue those medications postoperatively.?? If a diuretic is being used we will stop them on discharge to prevent dehydration particularly with the sleeve gastrectomy and the gastric bypass procedures.?? They will be instructed to monitor their blood pressure postoperatively while at home and notify their primary care physician in the event of any significantly high or uncharacteristic readings.    Weight Related Arthritis -The patient understands the benefits that weight loss surgery can have on their arthritis but also understands  that weight loss is not a guaranteed cure and relief of symptoms is often dependent on the severity of the underlying disease.??  The patient also understands that traditional pharmaceutical treatments for this diagnosis are usually unavailable to post-operative weight loss patients due to the effects on the gastrointestinal tract particularly with the gastric bypass and to a lesser effect with the sleeve gastrectomy.?? Any changes to the patient???s medication treatment will ultimately be made the patient???s PCP with input by our office.    Restrictive Airway Disease - We will continue all of their pulmonary medications in the form of oral pills and inhalers in both the perioperative and postoperative period. They understand that their symptoms should improve with weight loss. Any further testing related to this will be turned over to their family physician or pulmonologist.     Obstructive Sleep Apnea -The patient understands the association of sleep apnea and obesity and the additional risk that it caries related to post surgical complications. If they have not been tested for sleep apnea and I feel they are at increased risk for this diagnosis, then they will be scheduled for a consultation with a Pulmonologist for such. In the event that they cary this diagnosis we will have the patient bring their CPAP machine to the hospital for use both postoperatively in the PACU and on the floor at its appropriate setting.?? We will have them continue using it while at home after surgery and follow up with their pulmonologist 6 months after to be retested to see if it can be discontinued at that time period.  Will send for sleep study.    Signed By: Crissie Reese, MD     April 06, 2020

## 2020-04-06 NOTE — Progress Notes (Signed)
Ian Rose presents today for   Chief Complaint   Patient presents with   . New Patient       Is someone accompanying this pt? no    Is the patient using any DME equipment during OV? no    Coordination of Care:  1. Have you been to the ER, urgent care clinic since your last visit? Hospitalized since your last visit? no    2. Have you seen or consulted any other health care providers outside of the [facility] since your last visit? Include any pap smears or colon screening. no

## 2020-04-28 ENCOUNTER — Encounter

## 2020-04-29 ENCOUNTER — Ambulatory Visit: Admit: 2020-04-29 | Payer: MEDICAID | Primary: Internal Medicine

## 2020-04-29 ENCOUNTER — Ambulatory Visit: Admit: 2020-04-30 | Discharge: 2020-04-30 | Payer: MEDICAID | Attending: Specialist | Primary: Internal Medicine

## 2020-04-29 ENCOUNTER — Inpatient Hospital Stay: Payer: MEDICAID

## 2020-04-29 MED ORDER — BARIUM SULFATE 60 % (W/V) ORAL SUSP
60 % (w/v) | ORAL | Status: DC | PRN
Start: 2020-04-29 — End: 2020-04-29
  Administered 2020-04-29: 20:00:00 via ORAL

## 2020-04-29 NOTE — Procedures (Signed)
Patient:Ian Rose   DOB: 1981-03-13  Medical Record Number:[id number]            PREPROCEDURE DIAGNOSIS: This patient is preoperative for laparoscopic sleeve gastrectomy procedure with a history of  reflux disease.    POSTPROCEDURE DIAGNOSIS: This patient is preoperative for laparoscopic sleeve gastrectomy procedure with a history of  reflux disease.      PROCEDURES PERFORMED: Upper GI study with barium.    ESTIMATED BLOOD LOSS: None.    SPECIMENS: None.    STATEMENT OF MEDICAL NECESSITY: The patient is a patient with a  longstanding history of obesity. They are now considering the laparoscopic sleeve gastrectomy procedure as a means of surgical weight control and due to their history of reflux disease and are being assessed preoperatively for such.    DESCRIPTION OF PROCEDURE: The patient was brought to the fluoroscopy unit and  was given thin barium. On swallowing of barium, they were noted to have  normal peristalsis of their esophagus. They had prompt filling of distal  esophagus with tapering into the gastroesophageal junction. There was no evidence of a hiatal hernia present. Contrast then filled the gastric cardia, fundus,body and pre pyloric region with no abnormalities noted. Contrast then exited the pylorus in normal fashion. No obstruction was noted. There was no evidence of reflux noted.    (normal anatomy)    Angelique Holm, MD

## 2020-10-16 ENCOUNTER — Other Ambulatory Visit: Payer: Self-pay

## 2020-10-16 ENCOUNTER — Encounter (HOSPITAL_COMMUNITY): Payer: Self-pay

## 2020-10-16 ENCOUNTER — Emergency Department (HOSPITAL_COMMUNITY)
Admission: EM | Admit: 2020-10-16 | Discharge: 2020-10-16 | Disposition: A | Discharge status: Home / Self Care | Payer: Medicaid - Out of State | Attending: Emergency Medicine | Admitting: Emergency Medicine

## 2020-10-16 DIAGNOSIS — M7989 Other specified soft tissue disorders: Secondary | ICD-10-CM | POA: Diagnosis present

## 2020-10-16 DIAGNOSIS — L03116 Cellulitis of left lower limb: Secondary | ICD-10-CM | POA: Insufficient documentation

## 2020-10-16 DIAGNOSIS — E119 Type 2 diabetes mellitus without complications: Secondary | ICD-10-CM | POA: Diagnosis not present

## 2020-10-16 HISTORY — DX: Type 2 diabetes mellitus without complications: E11.9

## 2020-10-16 LAB — CBC WITH DIFFERENTIAL/PLATELET
Abs Immature Granulocytes: 0.05 10*3/uL (ref 0.00–0.07)
Basophils Absolute: 0 10*3/uL (ref 0.0–0.1)
Basophils Relative: 0 %
Eosinophils Absolute: 0 10*3/uL (ref 0.0–0.5)
Eosinophils Relative: 0 %
HCT: 41.8 % (ref 39.0–52.0)
Hemoglobin: 13.9 g/dL (ref 13.0–17.0)
Immature Granulocytes: 1 %
Lymphocytes Relative: 19 %
Lymphs Abs: 1.9 10*3/uL (ref 0.7–4.0)
MCH: 29.8 pg (ref 26.0–34.0)
MCHC: 33.3 g/dL (ref 30.0–36.0)
MCV: 89.7 fL (ref 80.0–100.0)
Monocytes Absolute: 0.6 10*3/uL (ref 0.1–1.0)
Monocytes Relative: 7 %
Neutro Abs: 7 10*3/uL (ref 1.7–7.7)
Neutrophils Relative %: 73 %
Platelets: 191 10*3/uL (ref 150–400)
RBC: 4.66 MIL/uL (ref 4.22–5.81)
RDW: 13.1 % (ref 11.5–15.5)
WBC: 9.6 10*3/uL (ref 4.0–10.5)
nRBC: 0 % (ref 0.0–0.2)

## 2020-10-16 LAB — COMPREHENSIVE METABOLIC PANEL
ALT: 24 U/L (ref 0–44)
AST: 20 U/L (ref 15–41)
Albumin: 3.5 g/dL (ref 3.5–5.0)
Alkaline Phosphatase: 86 U/L (ref 38–126)
Anion gap: 7 (ref 5–15)
BUN: 14 mg/dL (ref 6–20)
CO2: 30 mmol/L (ref 22–32)
Calcium: 9 mg/dL (ref 8.9–10.3)
Chloride: 101 mmol/L (ref 98–111)
Creatinine, Ser: 1.17 mg/dL (ref 0.61–1.24)
GFR, Estimated: >60 mL/min (ref >60)
Glucose, Bld: 133 mg/dL — ABNORMAL HIGH (ref 70–99)
Potassium: 3.1 mmol/L — ABNORMAL LOW (ref 3.5–5.1)
Sodium: 138 mmol/L (ref 135–145)
Total Bilirubin: 0.9 mg/dL (ref 0.3–1.2)
Total Protein: 7.7 g/dL (ref 6.5–8.1)

## 2020-10-16 LAB — LACTIC ACID, PLASMA: Lactic Acid, Venous: 1.5 mmol/L (ref 0.5–1.9)

## 2020-10-16 MED ORDER — DOXYCYCLINE HYCLATE 100 MG PO TABS
100.0000 mg | ORAL_TABLET | Freq: Once | ORAL | Status: AC
  Administered 2020-10-16: 100 mg via ORAL
  Filled 2020-10-16: qty 1

## 2020-10-16 MED ORDER — DOXYCYCLINE HYCLATE 100 MG PO CAPS: 100.0000 mg | ORAL_CAPSULE | Freq: Two times a day (BID) | ORAL | 0 refills | Status: AC

## 2020-10-16 MED ORDER — DOXYCYCLINE HYCLATE 100 MG PO TABS: 100.0000 mg | ORAL_TABLET | Freq: Once | ORAL | Status: DC

## 2020-10-16 MED ORDER — DOXYCYCLINE HYCLATE 100 MG PO TABS
100.0000 mg | ORAL_TABLET | Freq: Once | ORAL | Status: DC
  Filled 2020-10-16: qty 1

## 2020-10-16 NOTE — ED Notes (Signed)
Pt still needs second lactic aid

## 2020-10-16 NOTE — ED Triage Notes (Signed)
Patient reports waking up this am with left lower extremity redness,hot to touch, and pain with touch. Drainage noted. Hx DM

## 2020-10-16 NOTE — Discharge Instructions (Addendum)
Return for rapid spreading redness or fever.  

## 2020-10-16 NOTE — ED Provider Notes (Signed)
Emergency Medicine Provider Triage Evaluation Note  Dustin Gonzalez , a 40 y.o. male  was evaluated in triage.  Pt complains of L leg swelling and pain that began this morning. Hot touch. Was feeling fine yesterday. Diabetic.   Review of Systems  Positive: Leg pain and swelling  Negative: fevers  Physical Exam  BP (!) 188/120 (BP Location: Left Arm)   Pulse (!) 108   Temp 98.7 F (37.1 C) (Oral)   Resp 16   SpO2 97%  Gen:   Awake, no distress   Resp:  Normal effort  MSK:   L leg with edema, TTP, hot to touch, erythema, weeping throughout  Other:    Medical Decision Making  Medically screening exam initiated at 4:14 PM.  Appropriate orders placed.  Ralf Konopka was informed that the remainder of the evaluation will be completed by another provider, this initial triage assessment does not replace that evaluation, and the importance of remaining in the ED until their evaluation is complete.     Farrel Gordon, PA-C 10/16/20 1616    Derwood Kaplan, MD 10/20/20 1327

## 2020-10-16 NOTE — ED Provider Notes (Signed)
[name]   CSN: [id number] Arrival date & time: 10/16/20  1512     History Chief Complaint  Patient presents with   Leg Swelling    Dustin Gonzalez is a 40 y.o. male.  40 yo M with a chief complaints of left leg pain and swelling.  Going on since yesterday.  Willing mildly nauseated no fevers or chills.  Had a small break in the skin he thinks.  The history is provided by the patient.  Illness Severity:  Moderate Onset quality:  Gradual Duration:  2 days Timing:  Constant Progression:  Worsening Chronicity:  New Associated symptoms: no abdominal pain, no chest pain, no congestion, no diarrhea, no fever, no headaches, no myalgias, no rash, no shortness of breath and no vomiting       Past Medical History:  Diagnosis Date   Diabetes mellitus without complication (HCC)     There are no problems to display for this patient.   History reviewed. No pertinent surgical history.     History reviewed. No pertinent family history.  Social History   Tobacco Use   Smoking status: Unknown    Home Medications Prior to Admission medications   Medication Sig Start Date End Date Taking? Authorizing Provider  doxycycline (VIBRAMYCIN) 100 MG capsule Take 1 capsule (100 mg total) by mouth 2 (two) times daily. One po bid x 7 days 10/16/20  Yes Melene Plan, DO    Allergies    Shellfish allergy, Shellfish-derived products, and Iodine  Review of Systems   Review of Systems  Constitutional:  Negative for chills and fever.  HENT:  Negative for congestion and facial swelling.   Eyes:  Negative for discharge and visual disturbance.  Respiratory:  Negative for shortness of breath.   Cardiovascular:  Negative for chest pain and palpitations.  Gastrointestinal:  Negative for abdominal pain, diarrhea and vomiting.  Musculoskeletal:  Negative for arthralgias and myalgias.  Skin:  Positive for color change. Negative for rash.   Neurological:  Negative for tremors, syncope and headaches.  Psychiatric/Behavioral:  Negative for confusion and dysphoric mood.    Physical Exam Updated Vital Signs BP (!) 151/114   Pulse 90   Temp 98.7 F (37.1 C) (Oral)   Resp 20   SpO2 96%   Physical Exam Vitals and nursing note reviewed.  Constitutional:      Appearance: He is well-developed.  HENT:     Head: Normocephalic and atraumatic.  Eyes:     Pupils: Pupils are equal, round, and reactive to light.  Neck:     Vascular: No JVD.  Cardiovascular:     Rate and Rhythm: Normal rate and regular rhythm.     Heart sounds: No murmur heard.   No friction rub. No gallop.  Pulmonary:     Effort: No respiratory distress.     Breath sounds: No wheezing.  Abdominal:     General: There is no distension.     Tenderness: There is no abdominal tenderness. There is no guarding or rebound.  Musculoskeletal:        General: Swelling present. Normal range of motion.     Cervical back: Normal range of motion and neck supple.     Comments: Swelling and warmth to the left lower extremity in the demarcated area.  No induration no fluctuance.  Some mild blistering of the skin.  Skin:    Coloration: Skin is not pale.     Findings: No rash.  Neurological:  Mental Status: He is alert and oriented to person, place, and time.  Psychiatric:        Behavior: Behavior normal.    ED Results / Procedures / Treatments   Labs (all labs ordered are listed, but only abnormal results are displayed) Labs Reviewed  COMPREHENSIVE METABOLIC PANEL - Abnormal; Notable for the following components:      Result Value   Potassium 3.1 (*)    Glucose, Bld 133 (*)    All other components within normal limits  CBC WITH DIFFERENTIAL/PLATELET  LACTIC ACID, PLASMA  LACTIC ACID, PLASMA    EKG None  Radiology No results found.  Procedures Procedures   Medications Ordered in ED Medications  doxycycline (VIBRA-TABS) tablet 100 mg (100 mg Oral  Not Given 10/16/20 2221)  doxycycline (VIBRA-TABS) tablet 100 mg (100 mg Oral Not Given 10/16/20 2222)  doxycycline (VIBRA-TABS) tablet 100 mg (100 mg Oral Given 10/16/20 2221)    ED Course  I have reviewed the triage vital signs and the nursing notes.  Pertinent labs & imaging results that were available during my care of the patient were reviewed by me and considered in my medical decision making (see chart for details).    MDM Rules/Calculators/A&P                           40 yo M with a chief complaints of pain to his leg.  Clinically the patient has cellulitis.  Will start on antibiotics.  PCP follow-up.  10:22 PM:  I have discussed the diagnosis/risks/treatment options with the patient and believe the pt to be eligible for discharge home to follow-up with PCP. We also discussed returning to the ED immediately if new or worsening sx occur. We discussed the sx which are most concerning (e.g., sudden worsening pain, fever, inability to tolerate by mouth) that necessitate immediate return. Medications administered to the patient during their visit and any new prescriptions provided to the patient are listed below.  Medications given during this visit Medications  doxycycline (VIBRA-TABS) tablet 100 mg (100 mg Oral Not Given 10/16/20 2221)  doxycycline (VIBRA-TABS) tablet 100 mg (100 mg Oral Not Given 10/16/20 2222)  doxycycline (VIBRA-TABS) tablet 100 mg (100 mg Oral Given 10/16/20 2221)     The patient appears reasonably screen and/or stabilized for discharge and I doubt any other medical condition or other [facility] requiring further screening, evaluation, or treatment in the ED at this time prior to discharge.   Final Clinical Impression(s) / ED Diagnoses Final diagnoses:  Cellulitis of left lower extremity    Rx / DC Orders ED Discharge Orders          Ordered    doxycycline (VIBRAMYCIN) 100 MG capsule  2 times daily        10/16/20 2220             Melene Plan, DO 10/16/20  2222

## 2021-03-19 ENCOUNTER — Other Ambulatory Visit: Payer: Self-pay

## 2021-03-19 ENCOUNTER — Emergency Department (HOSPITAL_BASED_OUTPATIENT_CLINIC_OR_DEPARTMENT_OTHER)
Admission: EM | Admit: 2021-03-19 | Discharge: 2021-03-19 | Disposition: A | Discharge status: Home / Self Care | Payer: Self-pay | Attending: Emergency Medicine | Admitting: Emergency Medicine

## 2021-03-19 ENCOUNTER — Other Ambulatory Visit (HOSPITAL_BASED_OUTPATIENT_CLINIC_OR_DEPARTMENT_OTHER): Payer: Self-pay

## 2021-03-19 ENCOUNTER — Emergency Department (HOSPITAL_BASED_OUTPATIENT_CLINIC_OR_DEPARTMENT_OTHER): Payer: Self-pay

## 2021-03-19 ENCOUNTER — Encounter (HOSPITAL_BASED_OUTPATIENT_CLINIC_OR_DEPARTMENT_OTHER): Payer: Self-pay

## 2021-03-19 DIAGNOSIS — E119 Type 2 diabetes mellitus without complications: Secondary | ICD-10-CM | POA: Insufficient documentation

## 2021-03-19 DIAGNOSIS — G5601 Carpal tunnel syndrome, right upper limb: Secondary | ICD-10-CM | POA: Insufficient documentation

## 2021-03-19 DIAGNOSIS — M545 Low back pain, unspecified: Secondary | ICD-10-CM | POA: Insufficient documentation

## 2021-03-19 DIAGNOSIS — I10 Essential (primary) hypertension: Secondary | ICD-10-CM | POA: Insufficient documentation

## 2021-03-19 DIAGNOSIS — Z794 Long term (current) use of insulin: Secondary | ICD-10-CM | POA: Insufficient documentation

## 2021-03-19 MED ORDER — CYCLOBENZAPRINE HCL 10 MG PO TABS
10.0000 mg | ORAL_TABLET | Freq: Once | ORAL | Status: AC
  Administered 2021-03-19: 10 mg via ORAL
  Filled 2021-03-19: qty 1

## 2021-03-19 MED ORDER — IBUPROFEN 800 MG PO TABS
800.0000 mg | ORAL_TABLET | Freq: Once | ORAL | Status: AC
  Administered 2021-03-19: 800 mg via ORAL
  Filled 2021-03-19: qty 1

## 2021-03-19 MED ORDER — CYCLOBENZAPRINE HCL 10 MG PO TABS
10.0000 mg | ORAL_TABLET | Freq: Two times a day (BID) | ORAL | 0 refills | Status: AC | PRN
  Filled 2021-03-19: qty 20, 10d supply, fill #0

## 2021-03-19 NOTE — ED Provider Notes (Signed)
[facility]ENC[facility] DEPART[facility] Provider Note   CSN: [id number] Arrival date & time: 03/19/21  1029     History  Chief Complaint  Patient presents with   Back Pain   Arm Pain    Dustin Gonzalez is a 41 y.o. male with a past medical history of diabetes, hypertension, who presents to the ED complaining of bilateral lower back pain x4 weeks.  He works unloading trucks for living.  Denies recent injury, trauma, fall, increase in heavy lifting.  Has tried 500 mg Tylenol with no relief in his symptoms.  Denies fever, chills, cough, shortness of breath, chest pain, abdominal pain, nausea, vomiting, dysuria, hematuria, saddle paresthesia, bowel/bladder incontinence, color change, swelling.  He takes insulin twice daily.  He also notes numbness to his right second through fourth digits x1 month.  Denies recent injury, trauma, fall, heavy lifting.  Denies past medical history of carpal tunnel syndrome.  Patient is left-hand dominant.   The history is provided by the patient. No language interpreter was used.      Home Medications Prior to Admission medications   Medication Sig Start Date End Date Taking? Authorizing Provider  cyclobenzaprine (FLEXERIL) 10 MG tablet Take 1 tablet (10 mg total) by mouth 2 (two) times daily as needed for muscle spasms. 03/19/21  Yes Analea Muller A, PA-C  doxycycline (VIBRAMYCIN) 100 MG capsule Take 1 capsule (100 mg total) by mouth 2 (two) times daily. One po bid x 7 days 10/16/20   Deno Etienne, DO      Allergies    Shellfish allergy, Shellfish-derived products, and Iodine    Review of Systems   Review of Systems  Constitutional:  Negative for chills and fever.  Respiratory:  Negative for cough and shortness of breath.   Cardiovascular:  Negative for chest pain.  Gastrointestinal:  Negative for abdominal pain, nausea and vomiting.  Genitourinary:  Negative for dysuria.  Musculoskeletal:  Positive for back pain (spasms).  Skin:  Negative for rash.   All other systems reviewed and are negative.  Physical Exam Updated Vital Signs BP (!) 196/116    Pulse 81    Temp 98.2 F (36.8 C)    Resp 18    SpO2 99%  Physical Exam Vitals and nursing note reviewed.  Constitutional:      General: He is not in acute distress.    Appearance: He is not diaphoretic.  HENT:     Head: Normocephalic and atraumatic.     Mouth/Throat:     Pharynx: No oropharyngeal exudate.  Eyes:     General: No scleral icterus.    Conjunctiva/sclera: Conjunctivae normal.  Cardiovascular:     Rate and Rhythm: Normal rate and regular rhythm.     Pulses: Normal pulses.     Heart sounds: Normal heart sounds.  Pulmonary:     Effort: Pulmonary effort is normal. No respiratory distress.     Breath sounds: Normal breath sounds. No wheezing.  Abdominal:     General: Bowel sounds are normal.     Palpations: Abdomen is soft. There is no mass.     Tenderness: There is no abdominal tenderness. There is no guarding or rebound.  Musculoskeletal:        General: Normal range of motion.     Right hand: No swelling, deformity, lacerations, tenderness or bony tenderness. Normal range of motion. Normal strength. Normal sensation. Normal capillary refill. Normal pulse.     Left hand: No swelling, deformity, lacerations, tenderness or bony tenderness.  Normal range of motion. Normal strength. Normal sensation. Normal capillary refill. Normal pulse.     Cervical back: Normal, normal range of motion and neck supple.     Thoracic back: Normal.     Lumbar back: Spasms and tenderness present. No bony tenderness. Normal range of motion. Negative right straight leg raise test and negative left straight leg raise test.     Comments: Right hand with nodule noted to lateral aspect of fourth proximal phalangeal.  No overlying skin changes, erythema.  Mild tenderness to palpation noted to right hand.  Positive Tinel's and Phalen sign to right wrist.  No tenderness to palpation to right wrist.   Radial pulses intact bilaterally.  Full active range of motion of bilateral hands.   Mild tenderness to palpation to right trapezius, right bicep tendon, right upper arm, right forearm.  No overlying skin changes.No C, T, L, S spinal tenderness to palpation.  Mild tenderness to palpation to thoracic, lumbar, and sacral musculoskeletal.  Negative straight leg raise bilaterally.  Able to ambulate without assistance or difficulty.  Skin:    General: Skin is warm and dry.  Neurological:     Mental Status: He is alert.  Psychiatric:        Behavior: Behavior normal.    ED Results / Procedures / Treatments   Labs (all labs ordered are listed, but only abnormal results are displayed) Labs Reviewed - No data to display  EKG None  Radiology DG Hand 2 View Right  Result Date: 03/19/2021 CLINICAL DATA:  Pain EXAM: RIGHT HAND - 2 VIEW COMPARISON:  None. FINDINGS: There is no evidence of fracture or dislocation. There is no evidence of arthropathy or other focal bone abnormality. Soft tissues are unremarkable. IMPRESSION: No radiographic abnormality is seen in the right hand. Electronically Signed   By: Elmer Picker M.D.   On: 03/19/2021 13:35    Procedures Procedures    Medications Ordered in ED Medications  ibuprofen (ADVIL) tablet 800 mg (800 mg Oral Given 03/19/21 1336)  cyclobenzaprine (FLEXERIL) tablet 10 mg (10 mg Oral Given 03/19/21 1336)    ED Course/ Medical Decision Making/ A&P Clinical Course as of 03/19/21 1945  Fri Mar 19, 2021  1415 Patient reevaluated and noted slight improvement with symptoms [SB]    Clinical Course User Index [SB] Kimberla Driskill A, PA-C                           Medical Decision Making  Patient with lumbar back pain without radiation onset 4 weeks.  Patient without history of sciatica. No neurological deficits and normal neuro exam.  Patient is ambulatory without assistance or difficulty. No loss of bowel or bladder control.  No concern for cauda  equina.  No fever, night sweats, weight loss, h/o cancer, IVDA, no recent procedure to back. No urinary symptoms suggestive of UTI.  On exam, patient with tenderness to palpation to right trapezius, mild tenderness to palpation to lumbar musculature.  No spinal tenderness to palpation. No concerning findings on exams. Differential diagnosis includes but is not limited to fracture, muscle strain, cauda equina.    Patient also with nodule to right fourth digit ongoing for 4 weeks.  Patient's not tried medication for his symptoms.  Patient is left-hand dominant.  Patient works as a Therapist, music.  Denies any recent injury, trauma, heavy lifting.  Positive Tinel's sign and Phalen sign in the ED.  No overlying skin changes on exam.  Differential diagnosis includes fracture, dislocation, carpal tunnel syndrome.  Imaging: I have personally visualized and interpreted imaging findings.  Right hand x-ray without acute fracture or dislocation. I agree with radiologist interpretation.  Medication: Patient given Flexeril and ibuprofen in the ED with relief of his symptoms.  Patient presentation suspicious for muscle spasm.  Less likely fracture or cauda equina.  Patient hand presentation suspicious for carpal tunnel syndrome.  Will provide brace in the ED.  Dispostion: After consideration of the diagnostic results and the patients response to treatment, I feel that the patient would benefit from discharge home with Flexeril prescription.  Discussed with patient importance of not driving or operating heavy machinery while taking this medication.  Supportive care measures and return precautions discussed with patient and spouse at bedside.  Patient knowledges and verbalized understanding.  Patient appears safe for discharge at this time.  Follow-up as indicated in discharge paperwork.   This chart was dictated using voice recognition software, Dragon. Despite the best efforts of this provider to proofread and  correct errors, errors may still occur which can change documentation meaning.  Final Clinical Impression(s) / ED Diagnoses Final diagnoses:  Carpal tunnel syndrome of right wrist  Acute bilateral low back pain without sciatica    Rx / DC Orders ED Discharge Orders          Ordered    cyclobenzaprine (FLEXERIL) 10 MG tablet  2 times daily PRN        03/19/21 1421              Matti Killingsworth A, PA-C 03/19/21 1946    Malvin Johns, MD 03/22/21 [phone]

## 2021-03-19 NOTE — ED Triage Notes (Signed)
Pt c/o lower back pain x 2 weeks, states he has been having some spasms as well. Denies injury  Pt also reports right arm pain for over a month.

## 2021-03-19 NOTE — Discharge Instructions (Addendum)
It was a pleasure taking care of you!   Your right hand xray was negative in the ED. You were treated for your back pain in the ED with ibuprofen and flexeril.   You will be sent a prescription for Flexeril.  Take as prescribed.  Do not drive or operate heavy machinery with this medication.  You may take over-the-counter 600 mg ibuprofen every 6 hours as needed for pain.  You may apply ice or heat to the affected area for up to 15 minutes at a time.  Ensure to place a barrier between your skin and the ice/heat.  You may follow-up with your primary care provider as needed.  If you do not have a primary care provider you may follow-up with [facility] department as needed.  Return to the ED if you are experiencing increasing/worsening fever, inability to walk, increased swelling, or worsening symptoms.

## 2021-03-21 ENCOUNTER — Encounter (HOSPITAL_BASED_OUTPATIENT_CLINIC_OR_DEPARTMENT_OTHER): Payer: Self-pay | Admitting: Emergency Medicine

## 2021-03-21 ENCOUNTER — Emergency Department (HOSPITAL_BASED_OUTPATIENT_CLINIC_OR_DEPARTMENT_OTHER): Payer: Self-pay

## 2021-03-21 ENCOUNTER — Other Ambulatory Visit: Payer: Self-pay

## 2021-03-21 ENCOUNTER — Emergency Department (HOSPITAL_BASED_OUTPATIENT_CLINIC_OR_DEPARTMENT_OTHER)
Admission: EM | Admit: 2021-03-21 | Discharge: 2021-03-21 | Disposition: A | Discharge status: Home / Self Care | Payer: Self-pay | Attending: Emergency Medicine | Admitting: Emergency Medicine

## 2021-03-21 DIAGNOSIS — E119 Type 2 diabetes mellitus without complications: Secondary | ICD-10-CM | POA: Insufficient documentation

## 2021-03-21 DIAGNOSIS — M545 Low back pain, unspecified: Secondary | ICD-10-CM | POA: Insufficient documentation

## 2021-03-21 DIAGNOSIS — X500XXA Overexertion from strenuous movement or load, initial encounter: Secondary | ICD-10-CM | POA: Insufficient documentation

## 2021-03-21 DIAGNOSIS — R200 Anesthesia of skin: Secondary | ICD-10-CM | POA: Insufficient documentation

## 2021-03-21 DIAGNOSIS — Y990 Civilian activity done for income or pay: Secondary | ICD-10-CM | POA: Insufficient documentation

## 2021-03-21 DIAGNOSIS — I10 Essential (primary) hypertension: Secondary | ICD-10-CM | POA: Insufficient documentation

## 2021-03-21 HISTORY — DX: Essential (primary) hypertension: I10

## 2021-03-21 MED ORDER — LIDOCAINE 5 % EX PTCH
1.0000 | MEDICATED_PATCH | CUTANEOUS | Status: DC
  Filled 2021-03-21: qty 1

## 2021-03-21 MED ORDER — IBUPROFEN 800 MG PO TABS: 800.0000 mg | ORAL_TABLET | Freq: Three times a day (TID) | ORAL | 0 refills | Status: DC

## 2021-03-21 MED ORDER — ONDANSETRON HCL 4 MG PO TABS: 4.0000 mg | ORAL_TABLET | Freq: Four times a day (QID) | ORAL | 0 refills | Status: AC

## 2021-03-21 MED ORDER — HYDROCODONE-ACETAMINOPHEN 5-325 MG PO TABS: 2.0000 | ORAL_TABLET | ORAL | 0 refills | Status: AC | PRN

## 2021-03-21 MED ORDER — HYDROMORPHONE HCL 1 MG/ML IJ SOLN
1.0000 mg | Freq: Once | INTRAMUSCULAR | Status: AC
  Administered 2021-03-21: 1 mg via INTRAMUSCULAR
  Filled 2021-03-21: qty 1

## 2021-03-21 NOTE — Discharge Instructions (Signed)
Take 800 of ibuprofen 3 times daily with food and water.  For breakthrough pain take the narcotic, take it with the nausea medicine to prevent any nausea and vomiting.  Follow-up with the back specialist if your pain persists for another 2 weeks.

## 2021-03-21 NOTE — ED Provider Notes (Signed)
[facility] EMERGENCY DEPARTMENT Provider Note   CSN: [id number] Arrival date & time: 03/21/21  1334     History  Chief Complaint  Patient presents with   Back Pain    Dustin Gonzalez is a 41 y.o. male.   Back Pain Associated symptoms: numbness   Associated symptoms: no dysuria and no fever    This is a 41 year old male with history of diabetes, hypertension presenting due to low back pain.  Back pain is been going on for 4 weeks, its constant and worsened by ambulation.  It was atraumatic in nature, he does work in a job that requires heavy lifting.  The pain is to the lower back, occasionally feels it radiate down his legs bilaterally.  He was seen for this last week, given Flexeril and ibuprofen.  This has not alleviated his symptoms.  Reports she is unable to sleep secondary to pain.  Also endorses upper right extremity numbness that extends to the second third and fourth finger.  Feels like a shooting electric shock, was diagnosed with carpal tunnel.  Patient denies any urinary retention, saddle anesthesia, bilateral lower extremity weakness, prior malignancy, prior spinal procedures, IV drug use.  Past Medical History:  Diagnosis Date   Diabetes mellitus without complication ([place])    Hypertension      Home Medications Prior to Admission medications   Medication Sig Start Date End Date Taking? Authorizing Provider  cyclobenzaprine (FLEXERIL) 10 MG tablet Take 1 tablet (10 mg total) by mouth 2 (two) times daily as needed for muscle spasms. 03/19/21   Blue, Soijett A, PA-C  doxycycline (VIBRAMYCIN) 100 MG capsule Take 1 capsule (100 mg total) by mouth 2 (two) times daily. One po bid x 7 days 10/16/20   Deno Etienne, DO      Allergies    Shellfish allergy, Shellfish-derived products, and Iodine    Review of Systems   Review of Systems  Constitutional:  Negative for fever.  Eyes:  Negative for visual disturbance.  Genitourinary:  Negative for decreased urine volume,  dysuria, flank pain, frequency and hematuria.       No urinary retention   Musculoskeletal:  Positive for back pain.  Skin:  Negative for rash.  Allergic/Immunologic: Negative for immunocompromised state.  Neurological:  Positive for numbness. Negative for dizziness and syncope.       No saddle anesthesia, no bilateral leg numbness    Physical Exam Updated Vital Signs BP (!) 199/116    Pulse 75    Temp 98.3 F (36.8 C) (Oral)    Resp 18    Ht 6' (1.829 m)    Wt (!) 181.4 kg    SpO2 100%    BMI 54.25 kg/m  Physical Exam Vitals and nursing note reviewed. Exam conducted with a chaperone present.  Constitutional:      Appearance: Normal appearance. He is obese.  HENT:     Head: Normocephalic.  Eyes:     Extraocular Movements: Extraocular movements intact.     Pupils: Pupils are equal, round, and reactive to light.     Comments: No nystagmus   Neck:     Comments: No midline cervical tenderness. No palpable deformities.  Cardiovascular:     Rate and Rhythm: Normal rate and regular rhythm.     Pulses: Normal pulses.     Comments: DP, PT, and radial pulses 2+ and symmetrical bilaterally Pulmonary:     Effort: Pulmonary effort is normal.     Breath sounds: Normal breath  sounds.  Abdominal:     Tenderness: There is no right CVA tenderness or left CVA tenderness.  Musculoskeletal:        General: Tenderness present.     Cervical back: Normal range of motion. No rigidity or tenderness.     Comments: Midline tenderness to the lumbar spine.  Skin:    General: Skin is warm and dry.     Capillary Refill: Capillary refill takes less than 2 seconds.     Findings: No bruising or erythema.  Neurological:     Mental Status: He is alert and oriented to person, place, and time. Mental status is at baseline.     Comments: Patient is alert, oriented to personal, place and time with normal speech. Cranial nerves III-XII grossly in tact. Grip strength equal bilaterally LE strength equal bilaterally.  Sensation to light touch in tact bilaterally. No gait abnormalities, patient ambulatory.    Psychiatric:        Mood and Affect: Mood normal.    ED Results / Procedures / Treatments   Labs (all labs ordered are listed, but only abnormal results are displayed) Labs Reviewed - No data to display  EKG EKG Interpretation  Date/Time:  Sunday March 21 2021 14:09:24 EST Ventricular Rate:  78 PR Interval:  178 QRS Duration: 90 QT Interval:  380 QTC Calculation: 433 R Axis:   -6 Text Interpretation: Normal sinus rhythm Minimal voltage criteria for LVH, may be normal variant ( R in aVL ) Confirmed by Lennice Sites ([id number]) on 03/21/2021 2:57:16 PM  Radiology CT Lumbar Spine Wo Contrast  Result Date: 03/21/2021 CLINICAL DATA:  Low back pain, increased fracture risk EXAM: CT LUMBAR SPINE WITHOUT CONTRAST TECHNIQUE: Multidetector CT imaging of the lumbar spine was performed without intravenous contrast administration. Multiplanar CT image reconstructions were also generated. COMPARISON:  None. FINDINGS: Segmentation: 5 lumbar type vertebrae. Alignment: Normal. Vertebrae: No acute fracture or focal pathologic process. Paraspinal and other soft tissues: Negative. Disc levels: Limited evaluation due to technique and patient's body habitus. IMPRESSION: No acute fracture or subluxation. Evaluation is somewhat limited due to technique and patient's body habitus. MRI examination could be considered if clinically warranted. Electronically Signed   By: Keane Police D.O.   On: 03/21/2021 16:49    Procedures Procedures    Medications Ordered in ED Medications  lidocaine (LIDODERM) 5 % 1 patch (1 patch Transdermal Patient Refused/Not Given 03/21/21 1619)  HYDROmorphone (DILAUDID) injection 1 mg (1 mg Intramuscular Given 03/21/21 1618)    ED Course/ Medical Decision Making/ A&P                           Medical Decision Making  This is a 41 year old man with history of hypertension and diabetes presenting  due to low back pain.  He has been having low back pain for 4 weeks, there are no focal deficits on neuro exam.  He does have lumbar tenderness, was seen previously and has tried supportive care and failed.  We will proceed with CT lumbar to assess for malignant process, compression fracture, dislocation.  He does not have any red flag symptoms for cauda equina, epidural abscess is also unlikely given he is not febrile and does not have any history of IV drug use or immunocompromise state.  No CVA tenderness, also no hematuria making Pilo unlikely.  Patient reports improvement of his pain with IM Dilaudid.  Declined Lidoderm stating he is tried it previously and it  has not helped.  CT does not show any compression fracture, concerning findings.  There is limitation of his body habitus which I discussed with the patient, at this time given the lack of focal deficits on neuro exam and lack of trauma I do not think he needs emergent MRI.  Will discharge with ibuprofen 800 and short course of narcotics for breakthrough pain.  We will also give information for neurosurgery follow-up as needed if the pain persist.  We did discuss weight loss as well to help alleviate the tension on his spine and stress that this is most likely musculoskeletal but if it persist longer he will need additional work-up and possibly MRI.  Patient discharged in stable condition.        Final Clinical Impression(s) / ED Diagnoses Final diagnoses:  None    Rx / DC Orders ED Discharge Orders     None         Sherrill Raring, PA-C 03/21/21 [place], [place], DO 03/21/21 1906

## 2021-03-21 NOTE — ED Triage Notes (Signed)
Pt arrives pov, ambulatory to triage c/o back pain that is not responding to rx flexeril from 03/19/2021. Took flexeril and ibuprofen at 0900 with no relief

## 2021-03-21 NOTE — ED Notes (Signed)
Patient transported to CT 

## 2021-04-08 ENCOUNTER — Other Ambulatory Visit: Payer: Self-pay

## 2021-04-08 ENCOUNTER — Encounter (HOSPITAL_BASED_OUTPATIENT_CLINIC_OR_DEPARTMENT_OTHER): Payer: Self-pay

## 2021-04-08 ENCOUNTER — Emergency Department (HOSPITAL_BASED_OUTPATIENT_CLINIC_OR_DEPARTMENT_OTHER)
Admission: EM | Admit: 2021-04-08 | Discharge: 2021-04-08 | Disposition: A | Discharge status: Home / Self Care | Payer: Self-pay | Attending: Emergency Medicine | Admitting: Emergency Medicine

## 2021-04-08 DIAGNOSIS — E119 Type 2 diabetes mellitus without complications: Secondary | ICD-10-CM | POA: Insufficient documentation

## 2021-04-08 DIAGNOSIS — M545 Low back pain, unspecified: Secondary | ICD-10-CM | POA: Insufficient documentation

## 2021-04-08 DIAGNOSIS — I10 Essential (primary) hypertension: Secondary | ICD-10-CM | POA: Insufficient documentation

## 2021-04-08 DIAGNOSIS — X500XXA Overexertion from strenuous movement or load, initial encounter: Secondary | ICD-10-CM | POA: Insufficient documentation

## 2021-04-08 DIAGNOSIS — Z79899 Other long term (current) drug therapy: Secondary | ICD-10-CM | POA: Insufficient documentation

## 2021-04-08 MED ORDER — NAPROXEN 500 MG PO TABS: 500.0000 mg | ORAL_TABLET | Freq: Two times a day (BID) | ORAL | 0 refills | Status: AC

## 2021-04-08 MED ORDER — HYDROMORPHONE HCL 1 MG/ML IJ SOLN
1.0000 mg | Freq: Once | INTRAMUSCULAR | Status: AC
  Administered 2021-04-08: 1 mg via INTRAMUSCULAR
  Filled 2021-04-08: qty 1

## 2021-04-08 MED ORDER — AMLODIPINE BESYLATE 5 MG PO TABS
10.0000 mg | ORAL_TABLET | Freq: Once | ORAL | Status: AC
  Administered 2021-04-08: 10 mg via ORAL
  Filled 2021-04-08: qty 2

## 2021-04-08 MED ORDER — LISINOPRIL 10 MG PO TABS
10.0000 mg | ORAL_TABLET | Freq: Once | ORAL | Status: AC
  Administered 2021-04-08: 10 mg via ORAL
  Filled 2021-04-08: qty 1

## 2021-04-08 MED ORDER — AMLODIPINE BESYLATE 10 MG PO TABS: 10.0000 mg | ORAL_TABLET | Freq: Every day | ORAL | 0 refills | Status: AC

## 2021-04-08 NOTE — Discharge Instructions (Addendum)
Stop ibuprofen.  Start taking naproxen twice daily for the next week.  Follow-up with orthopedics, information provided above.  Continue taking your blood pressure medicine as prescribed.

## 2021-04-08 NOTE — ED Provider Notes (Signed)
[facility] EMERGENCY DEPT Provider Note   CSN: [id number] Arrival date & time: 04/08/21  1708     History  Chief Complaint  Patient presents with   Back Pain    Dustin Gonzalez is a 41 y.o. male.  The history is provided by the patient and a significant other.  Back Pain  Patient with history of hypertension and type 2 diabetes presents today with low back pain.  States she has had this off and on for months, he was feeling better but stated he started having lumbar back pain after moving heavy furniture earlier today.  He is been trying taking ibuprofen and Flexeril but denies any improvement of his pain.  He is also having intermittent shooting pains in the right arm which have been going on since 8 January.  Reports the ibuprofen takes the edge off the pain but it still persistent.  Denies any previous lumbar surgeries, saddle anesthesia, urinary retention.  Not having any fevers at home.  Of note, patient reports he ran out of his blood pressure medicine.  He is in the process of getting established with primary care but he has not taken any of his blood pressure medicine today.  Not having any chest pain, shortness of breath, headache, vision changes.  Home Medications Prior to Admission medications   Medication Sig Start Date End Date Taking? Authorizing Provider  amLODipine (NORVASC) 10 MG tablet Take 1 tablet (10 mg total) by mouth daily. 04/08/21  Yes Sherrill Raring, PA-C  naproxen (NAPROSYN) 500 MG tablet Take 1 tablet (500 mg total) by mouth 2 (two) times daily. 04/08/21  Yes Sherrill Raring, PA-C  cyclobenzaprine (FLEXERIL) 10 MG tablet Take 1 tablet (10 mg total) by mouth 2 (two) times daily as needed for muscle spasms. 03/19/21   Blue, Soijett A, PA-C  doxycycline (VIBRAMYCIN) 100 MG capsule Take 1 capsule (100 mg total) by mouth 2 (two) times daily. One po bid x 7 days 10/16/20   Deno Etienne, DO  HYDROcodone-acetaminophen (NORCO/VICODIN) 5-325 MG tablet Take 2 tablets by  mouth every 4 (four) hours as needed. 03/21/21   Sherrill Raring, PA-C  ondansetron (ZOFRAN) 4 MG tablet Take 1 tablet (4 mg total) by mouth every 6 (six) hours. 03/21/21   Sherrill Raring, PA-C      Allergies    Shellfish allergy, Shellfish-derived products, and Iodine    Review of Systems   Review of Systems  Musculoskeletal:  Positive for back pain.   Physical Exam Updated Vital Signs BP (!) 204/108 (BP Location: Right Arm)    Pulse 89    Temp 98.2 F (36.8 C)    Resp 16    Ht 6' (1.829 m)    Wt (!) 207.3 kg    SpO2 98%    BMI 61.98 kg/m  Physical Exam Vitals and nursing note reviewed. Exam conducted with a chaperone present.  Constitutional:      Appearance: Normal appearance. He is obese.  HENT:     Head: Normocephalic.  Eyes:     Extraocular Movements: Extraocular movements intact.     Pupils: Pupils are equal, round, and reactive to light.     Comments: No nystagmus   Neck:     Comments: No midline cervical tenderness. No palpable deformities.  Cardiovascular:     Rate and Rhythm: Normal rate and regular rhythm.     Pulses: Normal pulses.     Comments: DP, PT, and radial pulses 2+ and symmetrical bilaterally Pulmonary:  Effort: Pulmonary effort is normal.     Breath sounds: Normal breath sounds.  Abdominal:     Tenderness: There is no right CVA tenderness or left CVA tenderness.  Musculoskeletal:        General: Tenderness present.     Cervical back: Normal range of motion. No rigidity or tenderness.     Comments: Lumbar paraspinal tenderness. Tenderness at T12/L1-L2   Skin:    General: Skin is warm and dry.     Capillary Refill: Capillary refill takes less than 2 seconds.     Findings: No bruising or erythema.  Neurological:     Mental Status: He is alert and oriented to person, place, and time. Mental status is at baseline.     Comments: Patient is alert, oriented to personal, place and time with normal speech. Cranial nerves III-XII grossly in tact. Grip strength  equal bilaterally LE strength equal bilaterally. Sensation to light touch in tact bilaterally. No gait abnormalities, patient ambulatory.    Psychiatric:        Mood and Affect: Mood normal.    ED Results / Procedures / Treatments   Labs (all labs ordered are listed, but only abnormal results are displayed) Labs Reviewed - No data to display  EKG None  Radiology No results found.  Procedures Procedures    Medications Ordered in ED Medications  amLODipine (NORVASC) tablet 10 mg (10 mg Oral Given 04/08/21 2024)  lisinopril (ZESTRIL) tablet 10 mg (10 mg Oral Given 04/08/21 2024)  HYDROmorphone (DILAUDID) injection 1 mg (1 mg Intramuscular Given 04/08/21 2025)    ED Course/ Medical Decision Making/ A&P                           Medical Decision Making Risk Prescription drug management.   This patient presents to the ED for concern of low back pain, this involves an extensive number of treatment options, and is a complaint that carries with it a high risk of complications and morbidity.  The differential diagnosis includes cauda equina, epidural abscess, herniated disc, fracture, muscle strain, radiculopathy, other   Co morbidities that complicate the patient evaluation: HTN   Additional history obtained: -Additional history obtained from spouse. I've seen patient in ED before on 03/21/21.  -External records from outside source obtained and reviewed including: Chart review including previous notes, labs, imaging, consultation notes   Medicines ordered and prescription drug management: -I ordered medication including pain medicine, home blood pressure meds for back pain and asymptomatic HTN  -Reevaluation of the patient after these medicines showed that the patient improved -I have reviewed the patients home medicines and have made adjustments as needed   ED Course: Neurologically intact, no focal deficits or acute trauma.  No red flag symptoms for cauda equina.  Not  febrile, ambulatory.  Do not feel he would benefit from imaging at this time, advised him to follow-up with orthopedics for additional work-up and evaluation.  Suspect muscle strain.  Patient is hypertensive but does not have any symptoms that be concerning for hypertensive emergency.  Given blood pressure is less than 210 systolic do not feel work-up for hypertensive emergency warranted at this time especially in the context of asymptomatic patient.    Reevaluation: After the interventions noted above, I reevaluated the patient and found that they have :improved   Dispostion: D/C, ortho follow up. HTN med refilled.          Final Clinical Impression(s) / ED  Diagnoses Final diagnoses:  None    Rx / DC Orders ED Discharge Orders          Ordered    naproxen (NAPROSYN) 500 MG tablet  2 times daily        04/08/21 2000    amLODipine (NORVASC) 10 MG tablet  Daily        04/08/21 2001              Sherrill Raring, [place] 04/08/21 2055    Gareth Morgan, MD 04/10/21 1244

## 2021-04-08 NOTE — ED Triage Notes (Signed)
Patient here POV from Home with Lower Back Pain.  Patient states Lower Back Pain has been present ever since Patient stopped moving furniture today.   OTC Medications and Flexeril are ineffective Patient states. Also endorses Shooting Pains in Right Arm.  NAD Noted during Triage. A&Ox4. GCS 15. Ambulatory.

## 2022-03-30 ENCOUNTER — Emergency Department (HOSPITAL_BASED_OUTPATIENT_CLINIC_OR_DEPARTMENT_OTHER)
Admission: EM | Admit: 2022-03-30 | Discharge: 2022-03-31 | Disposition: A | Discharge status: Home / Self Care | Payer: BLUE CROSS/BLUE SHIELD | Attending: Emergency Medicine | Admitting: Emergency Medicine

## 2022-03-30 ENCOUNTER — Emergency Department (HOSPITAL_BASED_OUTPATIENT_CLINIC_OR_DEPARTMENT_OTHER): Payer: BLUE CROSS/BLUE SHIELD

## 2022-03-30 ENCOUNTER — Other Ambulatory Visit: Payer: Self-pay

## 2022-03-30 ENCOUNTER — Emergency Department (HOSPITAL_BASED_OUTPATIENT_CLINIC_OR_DEPARTMENT_OTHER): Payer: BLUE CROSS/BLUE SHIELD | Admitting: Radiology

## 2022-03-30 ENCOUNTER — Encounter (HOSPITAL_BASED_OUTPATIENT_CLINIC_OR_DEPARTMENT_OTHER): Payer: Self-pay | Admitting: Emergency Medicine

## 2022-03-30 DIAGNOSIS — R1013 Epigastric pain: Secondary | ICD-10-CM | POA: Diagnosis present

## 2022-03-30 DIAGNOSIS — I10 Essential (primary) hypertension: Secondary | ICD-10-CM | POA: Diagnosis not present

## 2022-03-30 DIAGNOSIS — R112 Nausea with vomiting, unspecified: Secondary | ICD-10-CM | POA: Insufficient documentation

## 2022-03-30 DIAGNOSIS — E119 Type 2 diabetes mellitus without complications: Secondary | ICD-10-CM | POA: Diagnosis not present

## 2022-03-30 DIAGNOSIS — Z79899 Other long term (current) drug therapy: Secondary | ICD-10-CM | POA: Insufficient documentation

## 2022-03-30 LAB — COMPREHENSIVE METABOLIC PANEL
ALT: 15 U/L (ref 0–44)
AST: 14 U/L — ABNORMAL LOW (ref 15–41)
Albumin: 4.3 g/dL (ref 3.5–5.0)
Alkaline Phosphatase: 65 U/L (ref 38–126)
Anion gap: 9 (ref 5–15)
BUN: 17 mg/dL (ref 6–20)
CO2: 32 mmol/L (ref 22–32)
Calcium: 9.6 mg/dL (ref 8.9–10.3)
Chloride: 100 mmol/L (ref 98–111)
Creatinine, Ser: 1.26 mg/dL — ABNORMAL HIGH (ref 0.61–1.24)
GFR, Estimated: >60 mL/min (ref >60)
Glucose, Bld: 150 mg/dL — ABNORMAL HIGH (ref 70–99)
Potassium: 3.2 mmol/L — ABNORMAL LOW (ref 3.5–5.1)
Sodium: 141 mmol/L (ref 135–145)
Total Bilirubin: 0.7 mg/dL (ref 0.3–1.2)
Total Protein: 8.1 g/dL (ref 6.5–8.1)

## 2022-03-30 LAB — TROPONIN I (HIGH SENSITIVITY)
Troponin I (High Sensitivity): 21 ng/L — ABNORMAL HIGH (ref <18)
Troponin I (High Sensitivity): 18 ng/L — ABNORMAL HIGH (ref <18)

## 2022-03-30 LAB — CBG MONITORING, ED: Glucose-Capillary: 125 mg/dL — ABNORMAL HIGH (ref 70–99)

## 2022-03-30 LAB — CBC
HCT: 41.2 % (ref 39.0–52.0)
Hemoglobin: 13.9 g/dL (ref 13.0–17.0)
MCH: 29.8 pg (ref 26.0–34.0)
MCHC: 33.7 g/dL (ref 30.0–36.0)
MCV: 88.4 fL (ref 80.0–100.0)
Platelets: 295 10*3/uL (ref 150–400)
RBC: 4.66 MIL/uL (ref 4.22–5.81)
RDW: 13.3 % (ref 11.5–15.5)
WBC: 7.7 10*3/uL (ref 4.0–10.5)
nRBC: 0 % (ref 0.0–0.2)

## 2022-03-30 LAB — LIPASE, BLOOD: Lipase: <10 U/L — ABNORMAL LOW (ref 11–51)

## 2022-03-30 MED ORDER — ONDANSETRON HCL 4 MG/2ML IJ SOLN
4.0000 mg | Freq: Once | INTRAMUSCULAR | Status: AC | PRN
  Administered 2022-03-30: 4 mg via INTRAVENOUS
  Filled 2022-03-30: qty 2

## 2022-03-30 MED ORDER — MORPHINE SULFATE (PF) 4 MG/ML IV SOLN
4.0000 mg | Freq: Once | INTRAVENOUS | Status: AC
  Administered 2022-03-30: 4 mg via INTRAVENOUS
  Filled 2022-03-30: qty 1

## 2022-03-30 MED ORDER — METOCLOPRAMIDE HCL 10 MG PO TABS: 10.0000 mg | ORAL_TABLET | Freq: Four times a day (QID) | ORAL | 0 refills | Status: AC | PRN

## 2022-03-30 MED ORDER — PANTOPRAZOLE SODIUM 40 MG IV SOLR
40.0000 mg | Freq: Once | INTRAVENOUS | Status: AC
  Administered 2022-03-30: 40 mg via INTRAVENOUS
  Filled 2022-03-30: qty 10

## 2022-03-30 MED ORDER — IOHEXOL 350 MG/ML SOLN
100.0000 mL | Freq: Once | INTRAVENOUS | Status: AC | PRN
  Administered 2022-03-30: 100 mL via INTRAVENOUS

## 2022-03-30 MED ORDER — METOCLOPRAMIDE HCL 5 MG/ML IJ SOLN
10.0000 mg | Freq: Once | INTRAMUSCULAR | Status: AC
  Administered 2022-03-30: 10 mg via INTRAVENOUS
  Filled 2022-03-30: qty 2

## 2022-03-30 MED ORDER — SODIUM CHLORIDE 0.9 % IV BOLUS
500.0000 mL | Freq: Once | INTRAVENOUS | Status: AC
  Administered 2022-03-30: 500 mL via INTRAVENOUS

## 2022-03-30 NOTE — ED Triage Notes (Signed)
Abdominal pain for last 2 days Worse after drinking/ eating N/v+ denies diarrhea Comes and goes  Recent med changes patient believes is correlated.

## 2022-03-30 NOTE — ED Provider Notes (Signed)
  Provider Note MRN:  [id number]  Arrival date & time: 03/30/22    ED Course and Medical Decision Making  Assumed care from Dr. Laverta Baltimore at shift change.  Suspect gastritis and symptoms related to [facility] side effects.  Awaiting second troponin, anticipating discharge.  11:55 PM update: First troponin was minimally elevated and the second troponin is downtrending, patient appropriate for discharge.  Procedures  Final Clinical Impressions(s) / ED Diagnoses     ICD-10-CM   1. Epigastric pain  R10.13     2. Nausea and vomiting, unspecified vomiting type  R11.2       ED Discharge Orders          Ordered    metoCLOPramide (REGLAN) 10 MG tablet  Every 6 hours PRN        03/30/22 2333              Discharge Instructions      Your pain, I believe, was caused by increasing the dose of your medication at home.  This was well-controlled with medicines here which I prescribed for you.  Please discuss your ED visit with your primary care physician to decide on any dose changes or medication changes needed at home.    Barth Kirks. Sedonia Small, [place] [email or web address]    Maudie Flakes, MD 03/30/22 [phone]

## 2022-03-30 NOTE — Discharge Instructions (Signed)
Your pain, I believe, was caused by increasing the dose of your medication at home.  This was well-controlled with medicines here which I prescribed for you.  Please discuss your ED visit with your primary care physician to decide on any dose changes or medication changes needed at home.

## 2022-03-30 NOTE — ED Provider Notes (Signed)
Emergency Department Provider Note   I have reviewed the triage vital signs and the nursing notes.   HISTORY  Chief Complaint Abdominal Pain   HPI Dustin Gonzalez is a 42 y.o. male with past history of diabetes, hypertension, elevated BMI presents emergency department with epigastric abdominal pain in the setting of increasing his dose of Mounjaro.  He went from 5mg  -7.5mg  and shortly afterward developed severe epigastric pain along with nausea and vomiting.  He is trying to drink only liquids but would vomit that as well.  No fevers or chills.  No radiation into his chest.   Past Medical History:  Diagnosis Date   Diabetes mellitus without complication ([place])    Hypertension     Review of Systems  Constitutional: No fever/chills Eyes: No visual changes. ENT: No sore throat. Cardiovascular: Denies chest pain. Respiratory: Denies shortness of breath. Gastrointestinal: Epigastric abdominal pain. Positive nausea and vomiting.  No diarrhea.  No constipation. Musculoskeletal: Negative for back pain. Skin: Negative for rash. Neurological: Negative for headaches, focal weakness or numbness.   ____________________________________________   PHYSICAL EXAM:  VITAL SIGNS: ED Triage Vitals [03/30/22 2042]  Enc Vitals Group     BP (!) 181/123     Pulse Rate 81     Resp 20     Temp 98 F (36.7 C)     Temp src      SpO2 100 %   Constitutional: Alert and oriented. Patient with dry heaving. Appears uncomfortable.  Eyes: Conjunctivae are normal. Head: Atraumatic. Nose: No congestion/rhinnorhea. Mouth/Throat: Mucous membranes are dry.  Neck: No stridor.  Cardiovascular: Normal rate, regular rhythm. Good peripheral circulation. Grossly normal heart sounds.   Respiratory: Normal respiratory effort.  No retractions. Lungs CTAB. Gastrointestinal: Soft tenderness in the epigastric region. No distention.  Musculoskeletal: No lower extremity tenderness nor edema. No gross  deformities of extremities. Neurologic:  Normal speech and language. No gross focal neurologic deficits are appreciated.  Skin:  Skin is warm, dry and intact. No rash noted.  ____________________________________________   LABS (all labs ordered are listed, but only abnormal results are displayed)  Labs Reviewed  LIPASE, BLOOD - Abnormal; Notable for the following components:      Result Value   Lipase <10 (*)    All other components within normal limits  COMPREHENSIVE METABOLIC PANEL - Abnormal; Notable for the following components:   Potassium 3.2 (*)    Glucose, Bld 150 (*)    Creatinine, Ser 1.26 (*)    AST 14 (*)    All other components within normal limits  CBG MONITORING, ED - Abnormal; Notable for the following components:   Glucose-Capillary 125 (*)    All other components within normal limits  TROPONIN I (HIGH SENSITIVITY) - Abnormal; Notable for the following components:   Troponin I (High Sensitivity) 21 (*)    All other components within normal limits  TROPONIN I (HIGH SENSITIVITY) - Abnormal; Notable for the following components:   Troponin I (High Sensitivity) 18 (*)    All other components within normal limits  CBC   ____________________________________________  EKG   EKG Interpretation  Date/Time:  Wednesday March 30 2022 21:04:17 EST Ventricular Rate:  83 PR Interval:  228 QRS Duration: 103 QT Interval:  378 QTC Calculation: 445 R Axis:   -3 Text Interpretation: Sinus rhythm Prolonged PR interval Abnormal T, consider ischemia, lateral leads Confirmed by Nanda Quinton [id number]) on 03/30/2022 9:10:18 PM        ____________________________________________   PROCEDURES  Procedure(s) performed:   Procedures  None ____________________________________________   INITIAL IMPRESSION / ASSESSMENT AND PLAN / ED COURSE  Pertinent labs & imaging results that were available during my care of the patient were reviewed by me and considered in my medical  decision making (see chart for details).   This patient is Presenting for Evaluation of abdominal pain, which does require a range of treatment options, and is a complaint that involves a high risk of morbidity and mortality.  The Differential Diagnoses includes but is not exclusive to acute cholecystitis, intrathoracic causes for epigastric abdominal pain, gastritis, duodenitis, pancreatitis, small bowel or large bowel obstruction, abdominal aortic aneurysm, hernia, gastritis, etc.   Critical Interventions-    Medications  ondansetron (ZOFRAN) injection 4 mg (4 mg Intravenous Given 03/30/22 2056)  sodium chloride 0.9 % bolus 500 mL (0 mLs Intravenous Stopped 03/30/22 2245)  morphine (PF) 4 MG/ML injection 4 mg (4 mg Intravenous Given 03/30/22 2143)  pantoprazole (PROTONIX) injection 40 mg (40 mg Intravenous Given 03/30/22 2142)  metoCLOPramide (REGLAN) injection 10 mg (10 mg Intravenous Given 03/30/22 2142)  iohexol (OMNIPAQUE) 350 MG/ML injection 100 mL (100 mLs Intravenous Contrast Given 03/30/22 2216)    Reassessment after intervention: Symptoms significantly improved.    I did obtain Additional Historical Information from wife at bedside.   Clinical Laboratory Tests Ordered, included unchanged troponin x 2. No AKI. Lipase normal.   Radiologic Tests Ordered, included CT abdomen/pelvis. I independently interpreted the images and agree with radiology interpretation.    Medical Decision Making: Summary:  Patient presents emergency department with with epigastric abdominal pain and vomiting.  Tenderness on exam but labs are reassuring.  Lower suspicion for ACS at this this. CT pending.   Reevaluation with update and discussion with CT unremarkable. Patient is feeling much better. Plan for discharge.   Considered admission but symptoms improved and tolerating PO.   Patient's presentation is most consistent with acute presentation with potential threat to life or bodily function.    Disposition: discharge  ____________________________________________  FINAL CLINICAL IMPRESSION(S) / ED DIAGNOSES  Final diagnoses:  Epigastric pain  Nausea and vomiting, unspecified vomiting type     NEW OUTPATIENT MEDICATIONS STARTED DURING THIS VISIT:  Discharge Medication List as of 03/30/2022 11:57 PM     START taking these medications   Details  metoCLOPramide (REGLAN) 10 MG tablet Take 1 tablet (10 mg total) by mouth every 6 (six) hours as needed for nausea or vomiting., Starting Wed 03/30/2022, Normal        Note:  This document was prepared using Dragon voice recognition software and may include unintentional dictation errors.  Nanda Quinton, MD, [facility]    Orvilla Truett, [place] [facility], MD 03/31/22 [phone]

## 2022-03-31 ENCOUNTER — Emergency Department (HOSPITAL_BASED_OUTPATIENT_CLINIC_OR_DEPARTMENT_OTHER)
Admission: EM | Admit: 2022-03-31 | Discharge: 2022-03-31 | Disposition: A | Discharge status: Home / Self Care | Payer: BLUE CROSS/BLUE SHIELD | Source: Home / Self Care | Attending: Emergency Medicine | Admitting: Emergency Medicine

## 2022-03-31 ENCOUNTER — Other Ambulatory Visit: Payer: Self-pay

## 2022-03-31 ENCOUNTER — Encounter (HOSPITAL_BASED_OUTPATIENT_CLINIC_OR_DEPARTMENT_OTHER): Payer: Self-pay

## 2022-03-31 DIAGNOSIS — R1013 Epigastric pain: Secondary | ICD-10-CM

## 2022-03-31 DIAGNOSIS — R112 Nausea with vomiting, unspecified: Secondary | ICD-10-CM | POA: Insufficient documentation

## 2022-03-31 DIAGNOSIS — Z79899 Other long term (current) drug therapy: Secondary | ICD-10-CM | POA: Insufficient documentation

## 2022-03-31 DIAGNOSIS — I10 Essential (primary) hypertension: Secondary | ICD-10-CM | POA: Insufficient documentation

## 2022-03-31 DIAGNOSIS — E119 Type 2 diabetes mellitus without complications: Secondary | ICD-10-CM | POA: Insufficient documentation

## 2022-03-31 LAB — CBC WITH DIFFERENTIAL/PLATELET
Abs Immature Granulocytes: 0.09 10*3/uL — ABNORMAL HIGH (ref 0.00–0.07)
Basophils Absolute: 0 10*3/uL (ref 0.0–0.1)
Basophils Relative: 0 %
Eosinophils Absolute: 0 10*3/uL (ref 0.0–0.5)
Eosinophils Relative: 0 %
HCT: 42.6 % (ref 39.0–52.0)
Hemoglobin: 14.1 g/dL (ref 13.0–17.0)
Immature Granulocytes: 1 %
Lymphocytes Relative: 14 %
Lymphs Abs: 1.2 10*3/uL (ref 0.7–4.0)
MCH: 29.6 pg (ref 26.0–34.0)
MCHC: 33.1 g/dL (ref 30.0–36.0)
MCV: 89.3 fL (ref 80.0–100.0)
Monocytes Absolute: 0.3 10*3/uL (ref 0.1–1.0)
Monocytes Relative: 4 %
Neutro Abs: 6.9 10*3/uL (ref 1.7–7.7)
Neutrophils Relative %: 81 %
Platelets: 231 10*3/uL (ref 150–400)
RBC: 4.77 MIL/uL (ref 4.22–5.81)
RDW: 13.5 % (ref 11.5–15.5)
WBC: 8.5 10*3/uL (ref 4.0–10.5)
nRBC: 0 % (ref 0.0–0.2)

## 2022-03-31 LAB — COMPREHENSIVE METABOLIC PANEL
ALT: 14 U/L (ref 0–44)
AST: 25 U/L (ref 15–41)
Albumin: 4.3 g/dL (ref 3.5–5.0)
Alkaline Phosphatase: 59 U/L (ref 38–126)
Anion gap: 10 (ref 5–15)
BUN: 14 mg/dL (ref 6–20)
CO2: 27 mmol/L (ref 22–32)
Calcium: 9.3 mg/dL (ref 8.9–10.3)
Chloride: 102 mmol/L (ref 98–111)
Creatinine, Ser: 1.15 mg/dL (ref 0.61–1.24)
GFR, Estimated: >60 mL/min (ref >60)
Glucose, Bld: 179 mg/dL — ABNORMAL HIGH (ref 70–99)
Potassium: 4.2 mmol/L (ref 3.5–5.1)
Sodium: 139 mmol/L (ref 135–145)
Total Bilirubin: 0.7 mg/dL (ref 0.3–1.2)
Total Protein: 8 g/dL (ref 6.5–8.1)

## 2022-03-31 LAB — LIPASE, BLOOD: Lipase: <10 U/L — ABNORMAL LOW (ref 11–51)

## 2022-03-31 MED ORDER — MORPHINE SULFATE (PF) 4 MG/ML IV SOLN
4.0000 mg | Freq: Once | INTRAVENOUS | Status: AC
  Administered 2022-03-31: 4 mg via INTRAVENOUS
  Filled 2022-03-31: qty 1

## 2022-03-31 MED ORDER — METOCLOPRAMIDE HCL 5 MG/ML IJ SOLN
10.0000 mg | Freq: Once | INTRAMUSCULAR | Status: AC
  Administered 2022-03-31: 10 mg via INTRAVENOUS
  Filled 2022-03-31: qty 2

## 2022-03-31 MED ORDER — PROMETHAZINE HCL 25 MG RE SUPP: 25.0000 mg | Freq: Four times a day (QID) | RECTAL | 0 refills | Status: AC | PRN

## 2022-03-31 MED ORDER — SODIUM CHLORIDE 0.9 % IV BOLUS
1000.0000 mL | Freq: Once | INTRAVENOUS | Status: AC
  Administered 2022-03-31: 1000 mL via INTRAVENOUS

## 2022-03-31 MED ORDER — HALOPERIDOL LACTATE 5 MG/ML IJ SOLN
5.0000 mg | Freq: Once | INTRAMUSCULAR | Status: AC
  Administered 2022-03-31: 5 mg via INTRAVENOUS
  Filled 2022-03-31: qty 1

## 2022-03-31 NOTE — ED Triage Notes (Signed)
Patient here POV from Home.  Endorses ABD Pain for approximately 3 Days. Associated with N/V. No Diarrhea. Was seen and treated yesterday and felt better at the time of Discharge. Returns for Symptoms as the Discomfort and N/V returned this AM.   Seen yesterday for Same and Discharged.  NAD noted during Triage. A&Ox4. GCS 15. BIB Wheelchair.

## 2022-03-31 NOTE — ED Provider Notes (Signed)
[facility] EMERGENCY DEPT Provider Note   CSN: [id number] Arrival date & time: 03/31/22  1613     History  Chief Complaint  Patient presents with   Abdominal Pain    Dustin Gonzalez is a 42 y.o. male.  Patient presents emergency department complaining of epigastric abdominal pain, nausea, and vomiting.  Patient was seen at the emergency department yesterday for the same complaints.  At that time he was given IV medication which improved his symptoms.  He states he was discharged this morning at approximately midnight and went home and was able to sleep throughout the night.  He states that upon waking he tried to take the Reglan which was prescribed last night's visit with water but vomited after 5 to 10 minutes.  He states he has been vomiting since that time.  He states he continues to have increased pain.  Symptoms began after his Greggory Keen was increased in dosage from 5 mg to 7.5 mg on Tuesday.  Past medical history significant for hypertension, type II DM without complication, previous cholecystectomy, obesity with a BMI greater than 60.    HPI     Home Medications Prior to Admission medications   Medication Sig Start Date End Date Taking? Authorizing Provider  promethazine (PHENERGAN) 25 MG suppository Place 1 suppository (25 mg total) rectally every 6 (six) hours as needed for nausea or vomiting. 03/31/22  Yes Barrie Dunker B, PA-C  amLODipine (NORVASC) 10 MG tablet Take 1 tablet (10 mg total) by mouth daily. 04/08/21   Theron Arista, PA-C  cyclobenzaprine (FLEXERIL) 10 MG tablet Take 1 tablet (10 mg total) by mouth 2 (two) times daily as needed for muscle spasms. 03/19/21   Blue, Soijett A, PA-C  doxycycline (VIBRAMYCIN) 100 MG capsule Take 1 capsule (100 mg total) by mouth 2 (two) times daily. One po bid x 7 days 10/16/20   Melene Plan, DO  HYDROcodone-acetaminophen (NORCO/VICODIN) 5-325 MG tablet Take 2 tablets by mouth every 4 (four) hours as needed. 03/21/21   Theron Arista,  PA-C  metoCLOPramide (REGLAN) 10 MG tablet Take 1 tablet (10 mg total) by mouth every 6 (six) hours as needed for nausea or vomiting. 03/30/22   Long, Arlyss Repress, MD  naproxen (NAPROSYN) 500 MG tablet Take 1 tablet (500 mg total) by mouth 2 (two) times daily. 04/08/21   Theron Arista, PA-C  ondansetron (ZOFRAN) 4 MG tablet Take 1 tablet (4 mg total) by mouth every 6 (six) hours. 03/21/21   Theron Arista, PA-C      Allergies    Shellfish allergy, Shellfish-derived products, and Iodine    Review of Systems   Review of Systems  Constitutional:  Negative for fever.  Respiratory:  Negative for shortness of breath.   Cardiovascular:  Negative for chest pain.  Gastrointestinal:  Positive for abdominal pain, nausea and vomiting.  Genitourinary:  Negative for dysuria.    Physical Exam Updated Vital Signs BP (!) 187/75   Pulse 82   Temp 98.4 F (36.9 C) (Oral)   Resp 16   Ht 6' (1.829 m)   Wt (!) 207.3 kg   SpO2 95%   BMI 61.98 kg/m  Physical Exam  ED Results / Procedures / Treatments   Labs (all labs ordered are listed, but only abnormal results are displayed) Labs Reviewed  CBC WITH DIFFERENTIAL/PLATELET - Abnormal; Notable for the following components:      Result Value   Abs Immature Granulocytes 0.09 (*)    All other components within normal limits  COMPREHENSIVE METABOLIC PANEL - Abnormal; Notable for the following components:   Glucose, Bld 179 (*)    All other components within normal limits  LIPASE, BLOOD - Abnormal; Notable for the following components:   Lipase <10 (*)    All other components within normal limits    EKG None  Radiology CT ABDOMEN PELVIS W CONTRAST  Result Date: 03/30/2022 CLINICAL DATA:  Abdominal pain, acute, nonlocalized EXAM: CT ABDOMEN AND PELVIS WITH CONTRAST TECHNIQUE: Multidetector CT imaging of the abdomen and pelvis was performed using the standard protocol following bolus administration of intravenous contrast. RADIATION DOSE REDUCTION: This  exam was performed according to the departmental dose-optimization program which includes automated exposure control, adjustment of the mA and/or kV according to patient size and/or use of iterative reconstruction technique. CONTRAST:  167mL OMNIPAQUE IOHEXOL 350 MG/ML SOLN COMPARISON:  None Available. FINDINGS: Lower chest: No acute abnormality Hepatobiliary: No focal liver abnormality is seen. Status post cholecystectomy. No biliary dilatation. Pancreas: No focal abnormality or ductal dilatation. Spleen: No focal abnormality.  Normal size. Adrenals/Urinary Tract: Punctate nonobstructing stone in the upper pole of the right kidney. No ureteral stones or hydronephrosis. No renal or adrenal mass. Urinary bladder unremarkable. Stomach/Bowel: Normal appendix. Stomach, large and small bowel grossly unremarkable. Vascular/Lymphatic: No evidence of aneurysm or adenopathy. Reproductive: No visible focal abnormality. Other: No free fluid or free air. Musculoskeletal: No acute bony abnormality. IMPRESSION: No acute findings in the abdomen or pelvis. Punctate right nephrolithiasis.  No hydronephrosis. Electronically Signed   By: Rolm Baptise M.D.   On: 03/30/2022 22:19   DG Chest Port 1 View  Result Date: 03/30/2022 CLINICAL DATA:  Chest pain and abdominal pain for 2 days, initial encounter EXAM: PORTABLE CHEST 1 VIEW COMPARISON:  None Available. FINDINGS: Cardiac shadow is enlarged. Lungs are clear bilaterally. No acute bony abnormality is seen. IMPRESSION: No active disease. Electronically Signed   By: Inez Catalina M.D.   On: 03/30/2022 21:37    Procedures Procedures    Medications Ordered in ED Medications  sodium chloride 0.9 % bolus 1,000 mL (0 mLs Intravenous Stopped 03/31/22 1828)  metoCLOPramide (REGLAN) injection 10 mg (10 mg Intravenous Given 03/31/22 1719)  morphine (PF) 4 MG/ML injection 4 mg (4 mg Intravenous Given 03/31/22 1720)  haloperidol lactate (HALDOL) injection 5 mg (5 mg Intravenous Given  03/31/22 1837)    ED Course/ Medical Decision Making/ A&P                             Medical Decision Making Amount and/or Complexity of Data Reviewed Labs: ordered.  Risk Prescription drug management.   This patient presents to the ED for concern of epigastric pain with nausea and vomiting, this involves an extensive number of treatment options, and is a complaint that carries with it a high risk of complications and morbidity.  The differential diagnosis includes adverse reaction to medication dosage increase, pancreatitis, cholecystitis, appendicitis, and others   Co morbidities that complicate the patient evaluation  History of recent increase of Mounjaro dosage, diabetes, obesity, hypertension   Additional history obtained:  Additional history obtained from family at bedside External records from outside source obtained and reviewed including emergency department notes from yesterday   Lab Tests:  I Ordered, and personally interpreted labs.  The pertinent results include: Grossly unremarkable CMP, CBC, lipase   Imaging Studies ordered:  I considered abdominal imaging but the patient had a CT abdomen pelvis with contrast last  night at approximately 10 PM which showed no acute findings   Cardiac Monitoring: / EKG:  The patient was maintained on a cardiac monitor.  I personally viewed and interpreted the cardiac monitored which showed an underlying rhythm of: Sinus rhythm   Problem List / ED Course / Critical interventions / Medication management   I ordered medication including Reglan and morphine for abdominal pain and nausea Reevaluation of the patient after these medicines showed that the patient  had improvements in his nausea but continued to have severe epigastric pain I ordered medication including Haldol.  Upon reassessment the patient had improved I have reviewed the patients home medicines and have made adjustments as needed   Test / Admission -  Considered:  The patient feels better after Haldol administration.  Nausea is currently under control.  The patient is requesting discharge home which seems reasonable at this time.  Plan to discharge patient home with prescription for Phenergan suppositories since he has been unable to tolerate oral medication. Return precautions provided         Final Clinical Impression(s) / ED Diagnoses Final diagnoses:  Epigastric pain  Nausea and vomiting, unspecified vomiting type    Rx / DC Orders ED Discharge Orders          Ordered    promethazine (PHENERGAN) 25 MG suppository  Every 6 hours PRN        03/31/22 2209              Ronny Bacon 03/31/22 2211    Blanchie Dessert, MD 03/31/22 [phone]

## 2022-03-31 NOTE — ED Notes (Signed)
Pt adamant in sitting in chair beside bed, refusing to lay back in bed at this time. Family member at the bedside.

## 2022-03-31 NOTE — Discharge Instructions (Signed)
You were seen today for nausea, vomiting, and epigastric pain.  This is potentially an adverse effect to your increased Mounjaro dosage.  Please follow-up with your primary care team for further evaluation and management.  If you develop any life-threatening symptoms if your nausea and vomiting become too severe to tolerate at home you may return to the emergency department for reevaluation.  I have prescribed Phenergan suppositories which can help with your nausea and vomiting.

## 2023-08-23 NOTE — Progress Notes (Signed)
 [facility] Return Patient Visit  Reason for visit: follow-up for hypertensive CM  History:   Pertinent History: HFrEF - diagnosed in 2020 while hospitalized for COVID with EF of 35% with GHK and severe LVH noted; suspected mostly likely due to hypertension. EF has remained around 35-40% since then. No prior ischemic evaluation.  Hypertension, uncontrolled Type 2 diabetes, uncontrolled with A1c 10.6%, associated neuropathy Morbid obesity (Body mass index is 62.48 kg/m.) Hyperlipidemia, on rosuvastatin 5mg   OSA, started back on CPAP in 2024 First degree AV block  Interval History: Returns today for scheduled follow-up. Last visit he remained severely hypertensive. We increased Entresto to 97/103mg  and added spironolactone 25mg . He never got the BMP draw that I ordered but subsequent labs have shown stable renal function and K (one K value was actually low). Mag has been chronically low. Appears he has since been started on hydralazine as well.   Mom recently passed, dealing with a lot of stress from that. Otherwise feels like he is doing ok. Hasn't taken meds last 2 days. Last time checked BP was yesterday and it was 162, typically ranges 140-160 or so. Hasn't taken lasix for the past month when he ran out - before that was taken every other day but he was waking up multiple times at night and still having to wake up at 6am for school. Resolved since he ran out.  No chest pain, dyspnea, orthopnea, PND, edema, dizziness, syncope, palpitations, claudication, TIA/stroke, or any bleeding episodes. Denies any new limitation to activity.  Weight is stable at home.  Other systems reviewed and unchanged.  Current Medications: Medications Ordered Prior to Encounter[1]  Medical, Surgical, Family and Social histories reviewed. Interval documentation, diagnostics, cardiographics, management changes, and hospital encounters were independently reviewed and interpreted as part of the history  and medical decision making.  Objective:  Physical Exam:   Vitals:   08/23/23 1608  BP: (!) 172/106  Pulse:   SpO2:     CONSTITUTIONAL: alert and conversant no distress CARDIOVASCULAR: Regular rhythm. No gallop, murmur, or rub. Normal S1/S2. Radial pulses 2+. JVP normal. PULMONARY/CHEST WALL: normal breath sounds bilaterally, normal wob ABDOMINAL: soft, non-tender, non-distended EXTREMITIES: no edema or peripheral cyanosis, warm and well-perfused  Pertinent labs: Lab Results  Component Value Date   NA 138 06/13/2023   K 3.6 06/13/2023   CREATININE 1.13 06/13/2023   CREATININE 1.26 01/17/2023   BUN 15 06/13/2023   CO2 31 06/13/2023   MG 1.5 (L) 06/13/2023   PROT 6.8 01/17/2023   ALBUMIN 3.8 01/17/2023   BILITOT 0.6 01/17/2023   AST 13 01/17/2023   ALT 17 01/17/2023   TRIG 186 (H) 06/13/2023   LDLDIRECT 137 (H) 04/29/2021   BNP <50 06/27/2022   HGB 13.4 (L) 07/27/2022   PLT 250 07/27/2022   TTE 04/06/22: Moderate-severe left ventricular hypertrophy  The left ventricle is mildly dilated.  Left ventricular systolic function is moderately reduced.  LV ejection fraction = 35-40%.  There is moderate global hypokinesis of the left ventricle.  There is no significant valvular stenosis or regurgitation.  There is no pericardial effusion.   I personally reviewed and interpreted the echo above.  Assessment & Plan:   Chronic HFrEF - diagnosed in 2020 while hospitalized for COVID with EF of 35% with GHK and severe LVH noted; suspected mostly likely due to hypertension. EF has remained around 35-40% since then. No prior ischemic evaluation.  Hypertension, remains suboptimal but improving; BP high today as he has not yet  taken medications. Type 2 diabetes, uncontrolled with A1c 10.6%, associated neuropathy Morbid obesity  Hyperlipidemia, on rosuvastatin 5mg , last LDL < 100 OSA, started back on CPAP in 2024  - Increase spironolactone 50mg  - Also on carvedilol 12.5, high-dose  Entresto, amlodipine  10mg  - Reheck BMP - We will repeat echo once BP controlled and therapy optimized  No orders of the defined types were placed in this encounter.     Plus medication changes as above  Health Maintenance issues including appropriate healthy diet and exercise were discussed with the patient. Risks, benefits, and alternatives of the medications and treatment plan prescribed today were discussed, and patient expressed understanding. Plan for follow-up as discussed or as needed if any worsening symptoms or change in condition. No follow-ups on file.  Thank you for the opportunity of assisting in the care of Dustin Gonzalez.  Please do not hesitate to call if you have any questions.    Glendia Ee, MD, [facility] Interventional and Critical Care Cardiology [facility]    Electronically signed by: Glendia Tanda Ee, MD 08/23/2023 3:55 PM        [1] Current Outpatient Medications on File Prior to Visit  Medication Sig Dispense Refill   amLODIPine  (NORVASC ) 10 mg tablet TAKE 1 TABLET BY MOUTH EVERY DAY 90 tablet 1   BisaCODYL (DULCOLAX) 5 mg EC tablet Take 10 mg by mouth daily as needed for constipation. 30 tablet 0   blood-glucose sensor (FreeStyle Libre 3 Plus Sensor) Apply new sensor every 15 days 6 each 1   carvediloL (COREG) 12.5 mg tablet TAKE 1 AND 1/2 TABLETS(18.75 MG) BY MOUTH IN THE MORNING AND IN THE EVENING WITH MEALS 270 tablet 1   cyanocobalamin (VITAMIN B12) 1,000 mcg/mL injection Inject 1000 mcg daily for 4 days, then once a week for 4 weeks, then once a month. 12 mL 0   DULoxetine (CYMBALTA) 30 mg capsule Take 2 capsules (60 mg total) by mouth daily. 60 capsule 1   ergocalciferol (VITAMIN D2) 1,250 mcg (50,000 unit) capsule Take 1 capsule (50,000 Units total) by mouth 2 (two) times weekly. 24 capsule 3   furosemide (LASIX) 20 mg tablet TAKE 1 TABLET BY MOUTH EVERY DAY 90 tablet 1    hydrALAZINE (APRESOLINE) 50 mg tablet Take 1 tablet (50 mg total) by mouth 2 (two) times a day. 180 tablet 3   magnesium oxide 400 mg (241 mg magnesium) tab Take 1 tablet (400 mg total) by mouth 2 (two) times a day. 180 tablet 1   Mounjaro 5 mg/0.5 mL subcutaneous pen injector Inject 5 mg under the skin every 7 days. Dx E 11.65 2 mL 3   rosuvastatin (CRESTOR) 5 mg tablet TAKE 1 TABLET (5 MG TOTAL) BY MOUTH DAILY. 90 tablet 1   sacubitriL-valsartan (Entresto) 97-103 mg per tablet Take 1 tablet by mouth 2 (two) times a day. 60 tablet 12   spironolactone (ALDACTONE) 25 mg tablet Take 1 tablet (25 mg total) by mouth daily. 90 tablet 3   syringe with needle, safety 3 mL 25 gauge x 5/8 syrg Use as directed for B-12 injections 20 each 0   Toujeo Max U-300 SoloStar 300 unit/mL (3 mL) pen pen INJECT 50 UNITS UNDER THE SKIN DAILY BEFORE BREAKFAST 12 mL 1   TRUEplus Pen Needle 32 gauge x 5/32 ndle Use to inject insulin [place] once daily 100 each 3   No current facility-administered medications on file prior to visit.

## 2023-11-13 IMAGING — CT CT L SPINE W/O CM
4 series · 15 of 33 positions shown, 17 images · non-contrast
Comparison: None.

CLINICAL DATA: Low back pain, increased fracture risk

EXAM:
CT LUMBAR SPINE WITHOUT CONTRAST
TECHNIQUE: Multidetector CT imaging of the lumbar spine was performed without
intravenous contrast administration. Multiplanar CT image
reconstructions were also generated.

[Series 4: l spine soft · axial · 0.39mm/px · z∈[+563,+655]mm · 4 of 124 slices shown]
[im 16/124  soft-tissue]
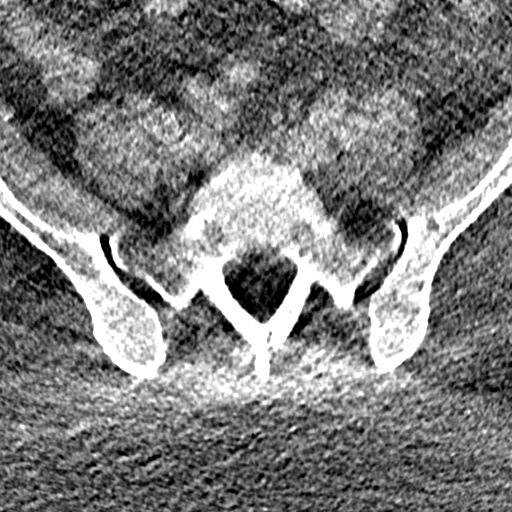
[im 31/124  soft-tissue]
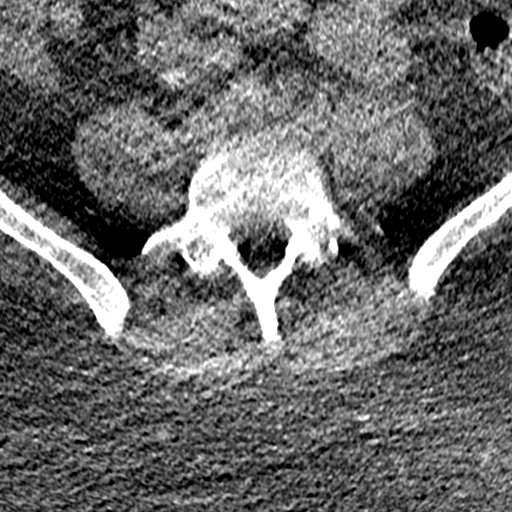
[im 47/124  soft-tissue]
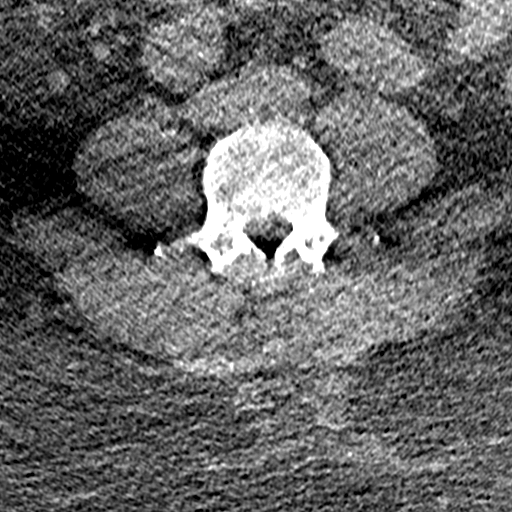
[im 62/124  soft-tissue]
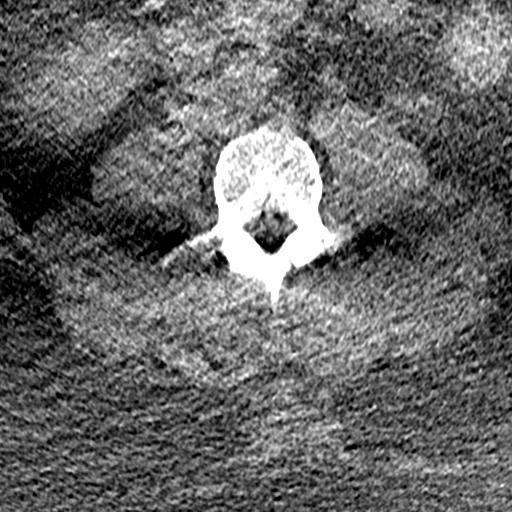

[Series 5: coronal bone · coronal · 0.36mm/px · 3 of 61 slices shown]
[im 13/61  bone]
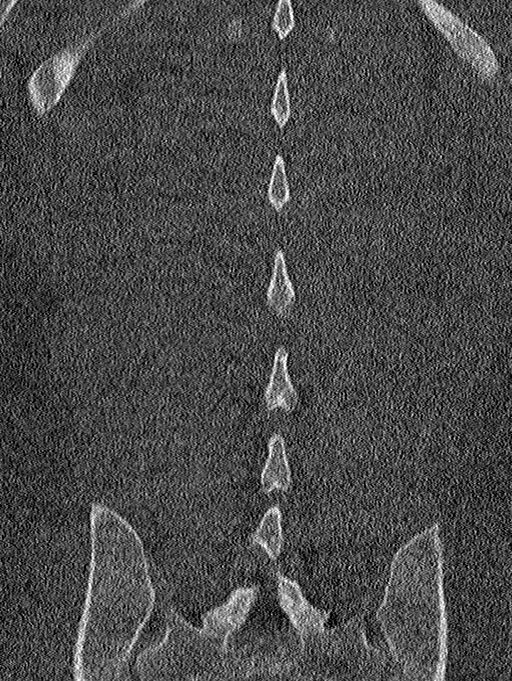
[im 25/61  bone]
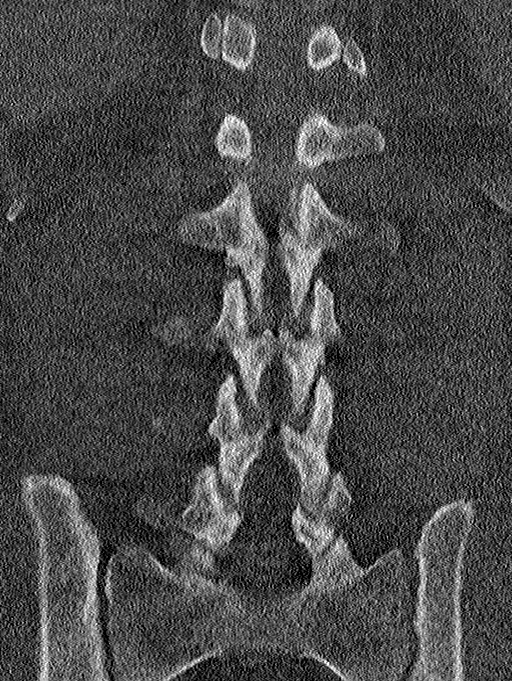
[im 37/61  bone]
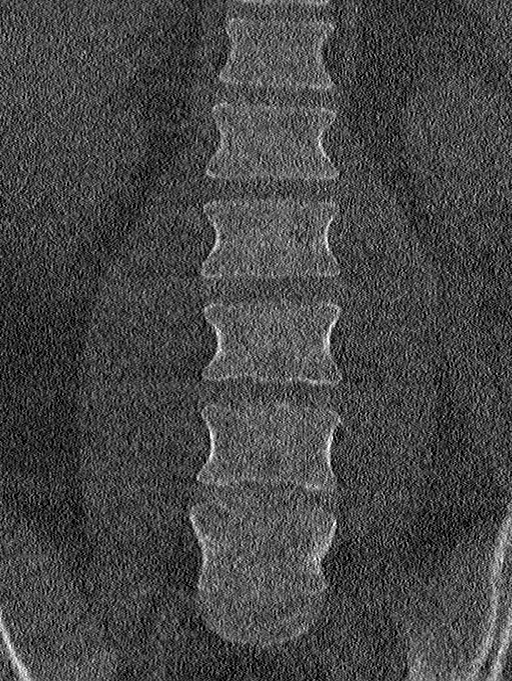

[Series 8: sagittal soft · sagittal · 0.36mm/px · 5 of 51 slices shown, 6 images]
[im 17/51  bone]
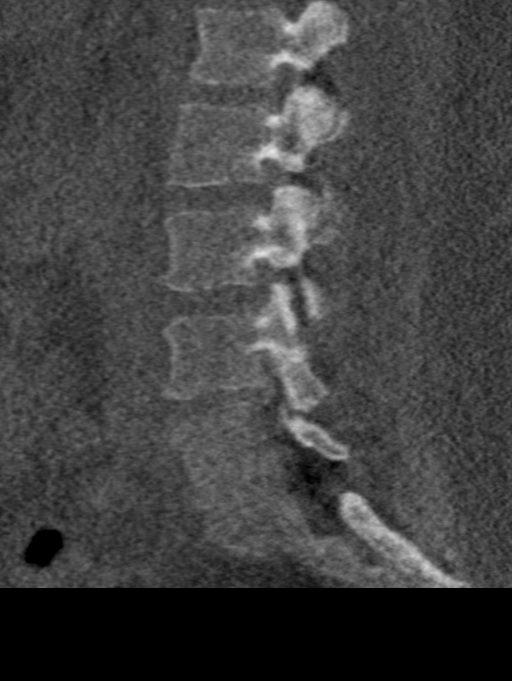
[im 21/51  bone]
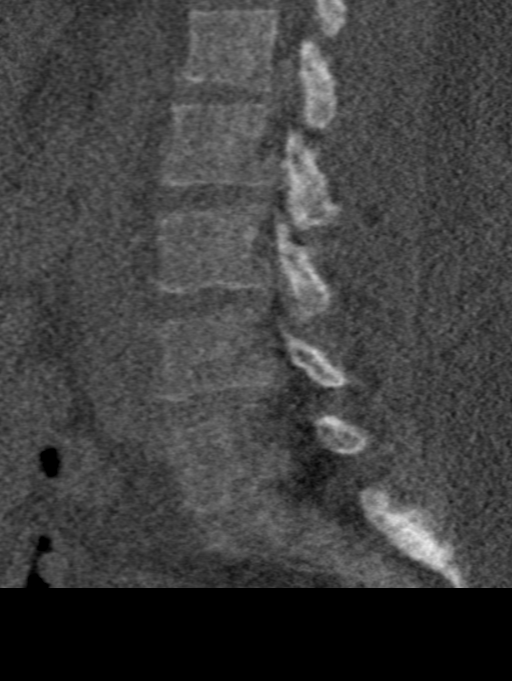
[im 26/51  soft-tissue]
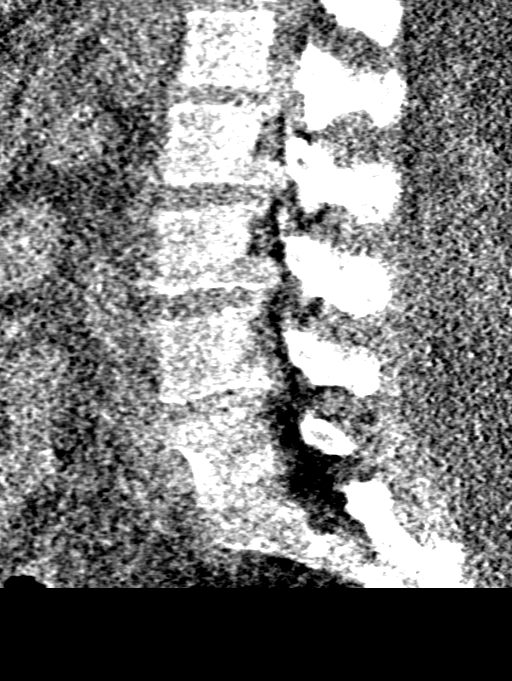
[im 26/51  bone]
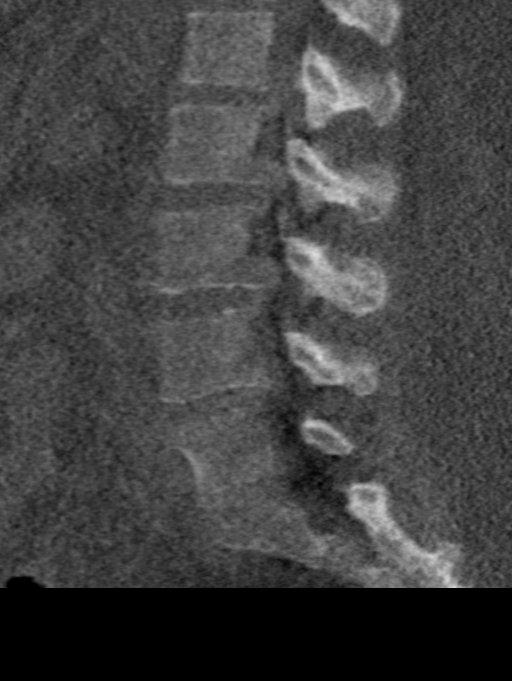
[im 30/51  bone]
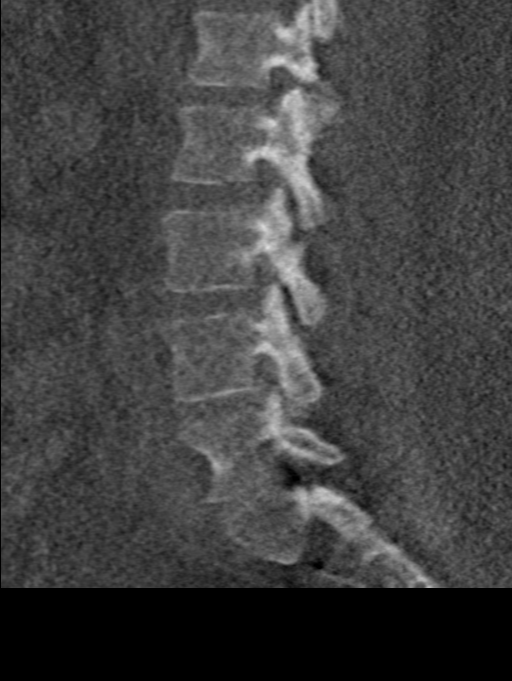
[im 34/51  bone]
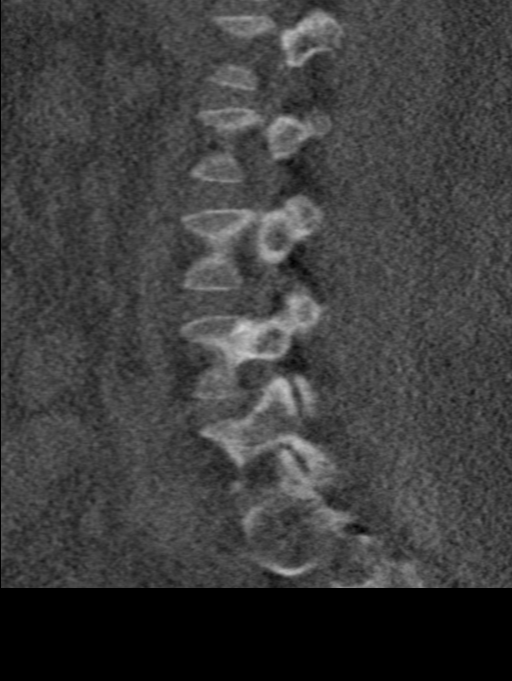

[Series 9: orthogonal axials bone · axial · 0.23mm/px · z∈[+686,+754]mm · 3 of 70 slices shown, 4 images]
[im 18/70  soft-tissue]
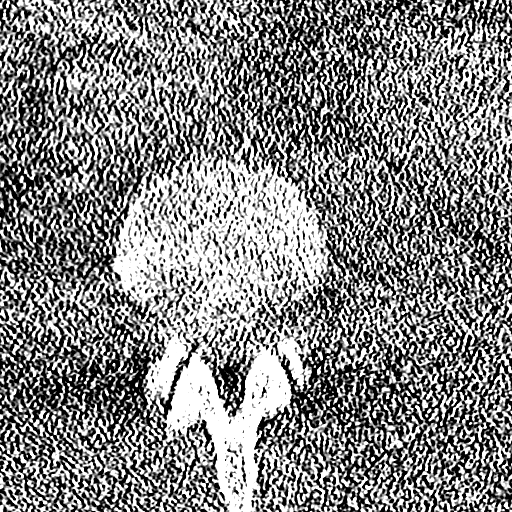
[im 18/70  bone]
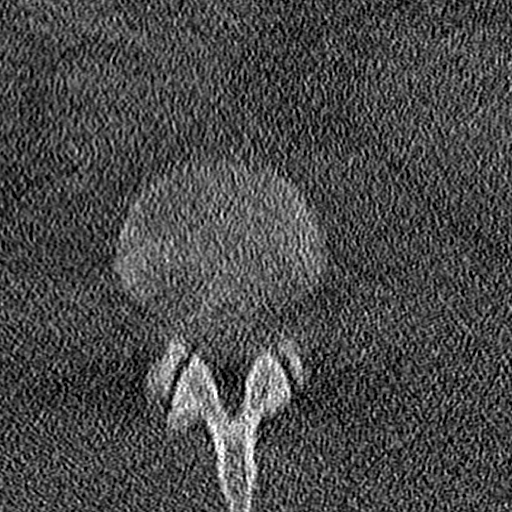
[im 35/70  bone]
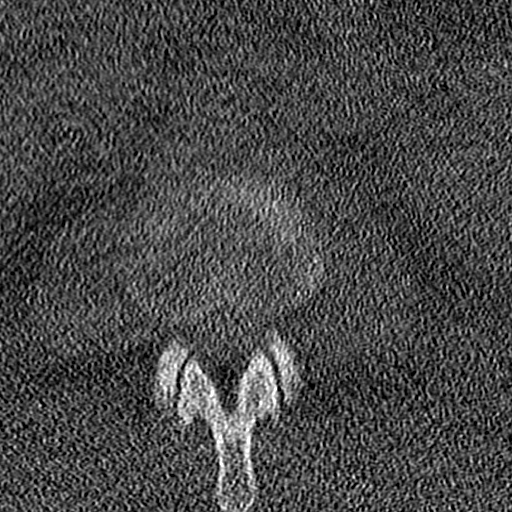
[im 52/70  bone]
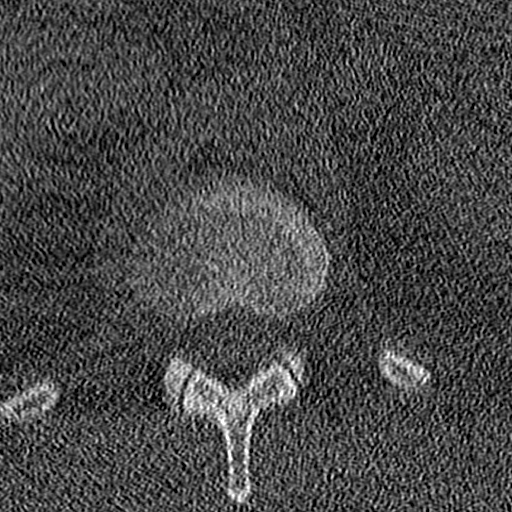

[15 of 33 positions shown; findings below may reference images not displayed]

FINDINGS: Segmentation: 5 lumbar type vertebrae.

Alignment: Normal.

Vertebrae: No acute fracture or focal pathologic process.

Paraspinal and other soft tissues: Negative.

Disc levels: Limited evaluation due to technique and patient's body
habitus.
IMPRESSION: No acute fracture or subluxation.

Evaluation is somewhat limited due to technique and patient's body
habitus. MRI examination could be considered if clinically
warranted.

## 2023-12-13 NOTE — Progress Notes (Signed)
 Mr. Dustin Gonzalez is a 43 year old male who returns for management of DM2.  He has had DM2 for over 5 years. He has family history of DM2 in maternal grandmother. He has never been hospitalized for DM2. 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 He has HTN, HLD, OSA, fatty liver, morbid obesity and reported cardiomyopathy.  He was initially seen in 07/2021.  He did not follow up until 03/2022.  His A1C at that time was 6.7%.  He last was seen in 04/2023.  He had not been seen in over one year.  He once again has not followed up as directed.  He was previously taking 70/30 insulin.  He was started on MOUNJARO at his initial visit.  He reports that he went to the ER earlier last year due to stomach pain related with the 7.5 mg dose of mounjaro.  His A1C at last visit was >15%.  His A1C is now 10%.  He was restarted on a lower dose of mounjaro at last visit.  He is taking toujeo 50 units in the morning.   He has lowered the dose.  He was previously instructed to take up to 80 units.  He is taking mounjaro 5 mg weekly on Fridays.   He is unable to take metformin due to nausea.   He is NOT checking his sugars with CGM.   His average sugar is unknown. He has rare low sugars.   He denies polyuria. He has polydipsia. He has nocturia multiple times per night.  The patient problem list, PMHx, Surgical Hx, Med list, Allergies, Family history and Personal History were reviewed during this visit.  ROS: GEN: no fever, + weight loss  CV: no chest pain, no SOB PULM: no cough, no DOE GI: no nausea or vomiting, no abdominal pain GU: no dysuria, no hematuria NEURO: no HA, no syncope  PE: General- well nourished, no distress, obese Neck: thyroid not enlarged , non tender Eyes: sclera clear, conjunctiva clear Lymph nodes: no LAD Lungs: B CTA, normal respiratory movement CV: no JVD, heart RRR Abdomen: soft, NTTP, bowel sounds present MSK: normal movement of extremities, no swelling of  legs Neuro: oriented, no motor dysfunction, reflexes normal Skin: no lesions   ASSESSMENT AND PLAN:  DM2 - A1C is 10% Needs improvement Continue TOUJEO - increase to 60 units Continue mounjaro at current dose Discussed diet and exercise- encouraged to walk daily Discussed need for eye exam His neuropathy is stable  HTN - BP showed some improvement on recheck, he has not taken his medications this am  HLD- continue rosuvastatin  Medical noncompliance - discussed importance of follow up at least every 6 months  Obesity - weight is down, encouraged to walk daily, discussed carb reduction  Follow up in 4 months

## 2023-12-19 NOTE — Progress Notes (Signed)
Encounter for drug screening

## 2023-12-20 NOTE — Progress Notes (Signed)
 Patient ID: Dustin Gonzalez is a 43 y.o. male.  Allergies[1]  Problem List[2]   Chief Complaint  Patient presents with   Cough    Pt reports cough for the last two weeks.  Reports cough is productive.  States has developed some SOB.  Also reports some streaks of blood in mucus.   Shortness of Breath     History of Present Illness: HPI  History of Present Illness This is a 43 year old male with a history of asthma or bronchitis presenting with shortness of breath.  The patient reports experiencing shortness of breath for the past 3 weeks, which has become more bothersome in the last 2 days. He contacted his primary care physician, who recommended immediate evaluation due to concerns of a possible infection. The patient has been coughing up mucus, which recently contained red streaks, suggesting the presence of blood. He describes the cough as lasting 10 to 15 minutes, primarily at night, and causing a sensation of nausea and wanting to vomit, although only mucus is produced. The patient also reports wheezing and occasional chest tightness. He uses albuterol four times daily and required a breathing treatment last night, which provided temporary relief before the coughing resumed.  The patient has sleep apnea and uses a CPAP machine, but finds it difficult to sleep with it due to a gagging sensation when he opens his mouth. He reports no leg or calf swelling, fever, nasal congestion, or runny nose, and has not been in contact with any sick individuals. He has no history of malignancy.     No report of fever, chest pain, night sweats, abdominal pain, nausea, vomiting, diarrhea, sore throat No Nocturnal dyspnea No Pedal edema No Orthopnea Patient has a negative PMH of CAD, PE, CHF Patient has a negative PMH of COPD Patient has a negative PMH of Pneumonia. Patient has positive PMH of Asthma, OSA, HTN and diastolic HF  Tobacco Use History[3]   Review of Systems: All others  reviewed and negative except as listed above.  Objective: Vitals:   12/20/23 1631 12/20/23 1706  BP: (!) 183/103 (!) 182/110  BP Location: Right arm Right arm  Patient Position: Sitting Sitting  Pulse: 81   Resp: (!) 26   Temp: 98.8 F (37.1 C)   TempSrc: Tympanic   SpO2: 94%   Weight: (!) 190 kg (419 lb)   Height: 1.803 m (5' 11)     Patient's vital signs reviewed.  Noted as afebrile, no tachycardia, noted to be tachypneic, no hypoxemia  Physical Exam: Physical Exam Constitutional:      General: Patient is not in acute respiratory distress.    Appearance: Appears to feel unwell. Non-toxic. Neurological:     General: No focal deficit present.     Mental Status: alert and oriented to person, place, and time.  HENT:     Eyes:     Conjunctiva/sclera: Conjunctivae normal; sclera clear. Pupils: Pupils are equal, round, and reactive to light.    Pharynx benign. Tonsils are non-erythematous; no exudate.    No uvula edema or deviation.    There is no bilateral anterior cervical lymphadenopathy.   TM's normal    No frontal or maxillary sinus tenderness. Cardiovascular:     Heart sounds: Normal heart sounds; regular. No murmur heard.    No Lower extremity edema noted    No carotid bruit. No JVD Pulmonary:     Effort: Pulmonary effort is normal. No Conversational dyspnea. No Retractions. No Tachypnea present during physical exam.  No Respiratory distress.     Congested cough.    Breath sounds: Bilateral lung fields diminished; rhonchi present. No rales.     No audible or auscultated wheezing.     No fremitus. No stridor. Musculoskeletal:        General: No apparent joint or muscle limitation.     Neck:  No rigidity; supple.  Skin:    General: Skin is warm and dry. No rash, lesions, erythema or ecchymosis Psychiatric:        Mood and Affect: Normal affect. Mood good. Appears to have good judgement.  Nebulization Evaluation:   Pre-neb- Non Labored,  poor airflow, no use  of accessory muscles, no tripoding.  Deep inspiration causes cough.  Post-neb x 1 Non Labored, improved airflow, no use of accessory muscles, no tripoding.  Deep inspiration causes cough.  Radiologist interpretation:   XR Chest 2 Views  Preliminary Result by Samuel Zibin See, MD (10/08 1843)  XR CHEST 2 VIEWS, 12/20/2023 5:05 PM    INDICATION: Shortness of breath \ R06.02 Shortness of breath   COMPARISON: 08/25/2023    FINDINGS:     Cardiovascular: Cardiomegaly.  Mediastinum: Within normal limits.  Lungs/pleura: Clear.  Upper abdomen: Visualized portions are unremarkable.   Chest wall/osseous structures: Polyarticular degenerative changes.      IMPRESSION:  Cardiomegaly. No overt pulmonary edema.          I have reviewed radiographs and my interpretation is:  No focal consolidation; however, there are bilateral opacities worrisome for pneumonia. Cardiomegaly. No identified cardiac or pulmonary abnormality.  No results found for this visit on 12/20/23.   New Medications Ordered This Visit  Medications   ipratropium-albuteroL (DUO-NEB) 0.5-2.5 mg/3 mL nebulizer solution 3 mL   amoxicillin (AMOXIL) 500 mg capsule    Sig: Take 2 capsules (1,000 mg total) by mouth 3 (three) times a day for 5 days.    Dispense:  30 capsule    Refill:  0   budesonide-formoteroL (Symbicort) 80-4.5 mcg/actuation inhaler    Sig: Inhale 2 puffs in the morning and 2 puffs before bedtime.    Dispense:  10.2 g    Refill:  0   benzonatate (TESSALON) 200 mg capsule    Sig: Take 1 capsule (200 mg total) by mouth 3 (three) times a day as needed for cough. Do not take with prom/dm cough syrup    Dispense:  30 capsule    Refill:  0   albuterol HFA (PROVENTIL HFA;VENTOLIN HFA;PROAIR HFA) 90 mcg/actuation inhaler    Sig: Inhale 2 puffs every 4 (four) hours as needed for shortness of breath.    Dispense:  8.5 g    Refill:  0    Assessment: Dustin Gonzalez was seen today for cough and shortness of  breath.  Diagnoses and all orders for this visit:  Community acquired pneumonia, unspecified laterality -     amoxicillin (AMOXIL) 500 mg capsule; Take 2 capsules (1,000 mg total) by mouth 3 (three) times a day for 5 days. -     budesonide-formoteroL (Symbicort) 80-4.5 mcg/actuation inhaler; Inhale 2 puffs in the morning and 2 puffs before bedtime. -     benzonatate (TESSALON) 200 mg capsule; Take 1 capsule (200 mg total) by mouth 3 (three) times a day as needed for cough. Do not take with prom/dm cough syrup -     albuterol HFA (PROVENTIL HFA;VENTOLIN HFA;PROAIR HFA) 90 mcg/actuation inhaler; Inhale 2 puffs every 4 (four) hours as needed for shortness of breath.  Asthma  with acute exacerbation, unspecified asthma severity, unspecified whether persistent  Type 2 diabetes mellitus with hyperglycemia, with long-term current use of insulin    (CMD)  Shortness of breath -     ipratropium-albuteroL (DUO-NEB) 0.5-2.5 mg/3 mL nebulizer solution 3 mL -     XR Chest 2 Views  Acute cough -     albuterol HFA (PROVENTIL HFA;VENTOLIN HFA;PROAIR HFA) 90 mcg/actuation inhaler; Inhale 2 puffs every 4 (four) hours as needed for shortness of breath.     Assessment & Plan Initial Assessment: 43 year old male with shortness of breath and coughing up mucus with red streaks for the past three weeks, worsening in the last two days.  Differential Diagnosis: - Pneumonia: Bilateral lower lobe opacities on chest x-ray. Treat with amoxicillin t.i.d. for 5 days, start Symbicort b.i.d., refill albuterol, prescribe Tessalon. Refrain from injectable steroids due to elevated A1c. Follow up with primary care doctor in 3 to 5 days. Return to ED for new onset of fever, worsening chest pain, shortness of breath, or other worrisome symptoms. - Asthma: History of asthma. Uses albuterol four times a day. Relief from nebulizer treatment but cough returned. Prescribe Symbicort b.i.d., refill albuterol. - Pulmonary embolism:  Report of hemoptysis.  No unilateral calf edema, no malignancy, prolonged sedation, recent surgery, hormone use, tachycardia, prior PE or DVT.  Low PERC (1) score. Considered, but less likely. - Diabetes Mellitus: Elevated A1c at 10. Steroids not recommended due to hyperglycemia.  UC Course: - Chest x-ray worrisome for bilateral opacities - Decadron steroid injection considered, but deferred due to hyperglycemia - Breathing treatment with duoneb - Reassessment after neb treatment; improved, more pronounced rhonchi - Prescribed amoxicillin, Symbicort, albuterol and Tessalon Perles - Counseled hyperglycemic management - Advised follow-up with PCP in 3 to 5 days - Discussed supportive care measures  Final Assessment: Patient presented with shortness of breath and coughing up mucus with red streaks. DuoNeb administered in clinic. Chest x-ray showed bilateral opacities suspicious for atypical pneumonia.  Prescribed amoxicillin, Symbicort, albuterol, and Tessalon. Advised to follow up with primary care doctor in 3 to 5 days and return to ED for fever, chest pain, worsening shortness of breath, orthopnea, night sweats or other new concerning symptoms.  Clinical Impression: - Pneumonia - Asthma exacerbation - Diabetes Mellitus - Shortness of breath  Disposition: - Follow-Up: Follow up with primary care doctor in 3 to 5 days  Patient Education: Provided home care instructions for pneumonia. Advised to wear CPAP nightly, perform breathing exercises (pulmonary toileting), maintain adequate hydration.    DDX: Pneumonia, Asthma Exacerbation, Seasonal Allergies, COPD exacerbation, Pneumothorax, Hypoxia, Acute Coronary Syndrome, Arrhythmia, Pericarditis, Pleural Effusion, Covid, Influenza, RSV, viral/bacterial URI, Pulmonary Embolism, Anemia, Congestive Heart Failure, Mononucleosis, Epiglottitis, Viral/bBacterial Bronchitis, Cirrhosis, Anaphylaxis, Foreign Body Aspiration  Plan: Urgent Care  Disposition:  Follow up with PCP  We spent time discussing supportive measures, including OTC medication options and conservative treatment approaches. I advised the patient in the anticipated course of illness. For any coughing symptoms, we did discuss that it can last up to 4 to 6 weeks, but should gradually improve.   Anticipated course of illness reviewed. Symptomatic management discussed.  Appropriate and anticipated follow up reviewed with patient. Patient was given verbal and written instructions on symptoms that necessitate return to the UC/ED, and instructed to f/u w/ UC or PCP if not improving in expected timeframe.  Patient education (verbal/handout) given on diagnosis, pathophysiology, treatment of diagnosis, medications & side effects, exercises, the follow-up plan and supportives measures.  We  discussed risks and side effects of medications, and also discussed red flags which would warrant immediate follow-up.    Patient and/or parent/guardian (if applicable) agreed with plan and voiced understanding.  No barriers to adherence perceived by myself.  Electronically signed by Sonny Caldron Arledge  Thu 12/21/2023 7:31 AM        [1] Allergies Allergen Reactions   Escitalopram Arthralgias, Fatigue and Myalgias    Headaches, UTI-like symptoms, No appetite   Shellfish Containing Products Anaphylaxis   Iodine Itching and Rash   Mounjaro [Tirzepatide] Other (See Comments) and GI Intolerance    Severe abdominal pain, with Mounjaro 7.5 mg weekly.  [2] Patient Active Problem List Diagnosis   Mild intermittent asthma without complication   Diastolic heart failure (HCC)   Essential hypertension   Functional dyspepsia   Hepatic steatosis   Metabolic syndrome   Class 3 severe obesity due to excess calories with serious comorbidity in adult (HCC)   QT prolongation   Type 2 diabetes mellitus with circulatory disorder (HCC)   OSA (obstructive sleep apnea)   Pure  hypercholesterolemia   Diabetic polyneuropathy associated with type 2 diabetes mellitus (HCC)   Primary osteoarthritis involving multiple joints   Vitamin D deficiency   HFrEF (heart failure with reduced ejection fraction)   First degree AV block   Sleep disturbance   Type 2 diabetes mellitus with hyperglycemia, with long-term current use of insulin    (CMD)   Hypomagnesemia   Chronic right-sided low back pain without sciatica   Other specified anxiety disorders   Mild major depression, single episode  [3] Social History Tobacco Use  Smoking Status Never  Smokeless Tobacco Never

## 2024-02-12 NOTE — Telephone Encounter (Addendum)
 Patient contacted and advised of providers recommendations, patient verbalized understanding.  Patient unavailable on Friday until after 1pm.  Appt scheduled With Internal Medicine Alexis Flatten, MD) 02/16/2024 at 3:20 PM

## 2024-02-12 NOTE — Telephone Encounter (Signed)
 States about two month ago was dx with pneumonia, and started having erectile dysfunction when gets intimate with his wife. States that started after he was dx with pneumonia.   Denies rash, itching, swelling, discharge, burning with urination, concern for STI, pain, blood form end of penis, foreskin stuck or unable to pull  back, fever, difficulty urinating, blood in urine, blood in semen,   States also having intermittent SOB with exertion. States last episode of SOB was on Friday when he was out shopping. States he had to stop to try to catch his breath. States he used his inhaler but did not help. States Saturday was fine and went to see lights walked around and had no issues with his breathing and has not had an issue since Friday. No SOB today. States feels like a panic attack when he gets the SOB.   Denies CP, wheezing, passed out or feeling faint, confusion, difficult to awaken, difficulty swallowing, irregular HR, hx/family hx of blood clots, recent major surgery/broken bones, recent long distance travel.   Offered appointment today with provider but unable to come in. Unable to come in the afternoons. States can only come in the morning. Please advise?

## 2024-02-12 NOTE — Telephone Encounter (Signed)
 Copied from CRM #[id number]. Topic: Schedule Appointment - Schedule Patient >> Feb 12, 2024  1:14 PM Rosina BROCKS wrote: [place], Dustin Gonzalez is calling other request    Include all details related to the request(s) below:  PATIENT CALLED BACK TO SPEAK WITH TRIAGE NURSE TO DISCUSS SYMPTOMS AND SCHEDULE APPOINTMENT.   ATTEMPTED TO TRANSFER TO TRIAGE PER NOTE.  NO ANSWER. PLEASE GIVE PATIENT A CALL BACK.    Confirm and type the Best Contact Number below:  Patient/caller contact number:       [phone]      [] Home  [x] Mobile  [] Work [] Other   [x] Okay to leave a voicemail   Medication List:  Current Outpatient Medications:    amLODIPine  (NORVASC ) 10 mg tablet, TAKE 1 TABLET BY MOUTH EVERY DAY, Disp: 90 tablet, Rfl: 1   BD Luer-Lok Syringe 3 mL 25 x 5/8 syrg, USE AS DIRECTED FOR BEFORE-12 INJECTIONS, Disp: , Rfl:    blood-glucose sensor (FreeStyle Libre 3 Plus Sensor), Use as directed to monitor blood sugar levels. E11.65 Change every 15 days, Disp: 2 each, Rfl: 5   budesonide-formoteroL (Symbicort) 80-4.5 mcg/actuation inhaler, Inhale 2 puffs in the morning and 2 puffs before bedtime., Disp: 10.2 g, Rfl: 0   carvediloL (COREG) 12.5 mg tablet, TAKE 1 AND 1/2 TABLETS(18.75 MG) BY MOUTH IN THE MORNING AND IN THE EVENING WITH MEALS, Disp: 270 tablet, Rfl: 1   cyanocobalamin (VITAMIN B12) 1,000 mcg/mL injection, Inject 1000 mcg every 30 days, Disp: 12 mL, Rfl: 0   DULoxetine (CYMBALTA) 30 mg capsule, Take 2 capsules (60 mg total) by mouth daily., Disp: 60 capsule, Rfl: 1   ergocalciferol (VITAMIN D2) 1,250 mcg (50,000 unit) capsule, TAKE 1 CAPSULE BY MOUTH 2 TIMES WEEKLY, Disp: 24 capsule, Rfl: 3   magnesium oxide 400 mg (241 mg magnesium) tab, Take 1 tablet (400 mg total) by mouth 2 (two) times a day., Disp: 180 tablet, Rfl: 1   Mounjaro 5 mg/0.5 mL subcutaneous pen injector, Inject 5 mg under the skin every 7 days. Dx E 11.65, Disp: 2 mL, Rfl: 5   rosuvastatin (CRESTOR) 5 mg  tablet, TAKE 1 TABLET (5 MG TOTAL) BY MOUTH DAILY., Disp: 90 tablet, Rfl: 1   sacubitriL-valsartan (Entresto) 97-103 mg per tablet, Take 1 tablet by mouth 2 (two) times a day., Disp: 60 tablet, Rfl: 12   spironolactone (ALDACTONE) 50 mg tablet, Take 1 tablet (50 mg total) by mouth daily., Disp: 90 tablet, Rfl: 3   SUMAtriptan (IMITREX) 25 mg tablet, Take 1 tablet (25 mg total) by mouth once as needed for migraine. May repeat dose once in 2 hours if no relief.  Do not exceed 2 doses in 24 hours., Disp: 9 tablet, Rfl: 0   syringe with needle, safety 3 mL 25 gauge x 5/8 syrg, Use as directed for B-12 injections, Disp: 20 each, Rfl: 0   Toujeo Max U-300 SoloStar 300 unit/mL (3 mL) pen pen, Inject 60 units [place] daily and adjust as indicated, Disp: 6 mL, Rfl: 5   TRUEplus Pen Needle 32 gauge x 5/32 ndle, USE ONCE DAILY TO INJECT INSULIN UNDER THE SKIN AS DIRECTED, Disp: 100 each, Rfl: 1     Medication Request/Refills: Pharmacy Information (if applicable)   [] Not Applicable       []  Pharmacy listed  Send Medication Request to:                                                 []   Pharmacy not listed (added to pharmacy list in Epic) Send Medication Request to:      Listed Pharmacies: [id number] [place], [place] - [address] AT [facility] OF [address] - PHONE: [phone] - FAX: [phone] [id number] - [place], [place] - [address] - PHONE: [phone] - FAX: [phone]

## 2024-02-22 ENCOUNTER — Emergency Department (HOSPITAL_BASED_OUTPATIENT_CLINIC_OR_DEPARTMENT_OTHER)
Admission: EM | Admit: 2024-02-22 | Discharge: 2024-02-22 | Disposition: A | Discharge status: Home / Self Care | Attending: Emergency Medicine | Admitting: Emergency Medicine

## 2024-02-22 ENCOUNTER — Other Ambulatory Visit: Payer: Self-pay

## 2024-02-22 ENCOUNTER — Emergency Department (HOSPITAL_BASED_OUTPATIENT_CLINIC_OR_DEPARTMENT_OTHER)

## 2024-02-22 DIAGNOSIS — E119 Type 2 diabetes mellitus without complications: Secondary | ICD-10-CM | POA: Diagnosis not present

## 2024-02-22 DIAGNOSIS — I110 Hypertensive heart disease with heart failure: Secondary | ICD-10-CM | POA: Diagnosis not present

## 2024-02-22 DIAGNOSIS — R1084 Generalized abdominal pain: Secondary | ICD-10-CM | POA: Diagnosis not present

## 2024-02-22 DIAGNOSIS — I509 Heart failure, unspecified: Secondary | ICD-10-CM | POA: Insufficient documentation

## 2024-02-22 DIAGNOSIS — R109 Unspecified abdominal pain: Secondary | ICD-10-CM | POA: Diagnosis present

## 2024-02-22 DIAGNOSIS — Z79899 Other long term (current) drug therapy: Secondary | ICD-10-CM | POA: Insufficient documentation

## 2024-02-22 DIAGNOSIS — R112 Nausea with vomiting, unspecified: Secondary | ICD-10-CM | POA: Diagnosis not present

## 2024-02-22 LAB — COMPREHENSIVE METABOLIC PANEL WITH GFR
ALT: 13 U/L (ref 0–44)
AST: 17 U/L (ref 15–41)
Albumin: 4.1 g/dL (ref 3.5–5.0)
Alkaline Phosphatase: 76 U/L (ref 38–126)
Anion gap: 11 (ref 5–15)
BUN: 15 mg/dL (ref 6–20)
CO2: 31 mmol/L (ref 22–32)
Calcium: 10 mg/dL (ref 8.9–10.3)
Chloride: 101 mmol/L (ref 98–111)
Creatinine, Ser: 1.36 mg/dL — ABNORMAL HIGH (ref 0.61–1.24)
GFR, Estimated: >60 mL/min (ref >60)
Glucose, Bld: 147 mg/dL — ABNORMAL HIGH (ref 70–99)
Potassium: 3.2 mmol/L — ABNORMAL LOW (ref 3.5–5.1)
Sodium: 142 mmol/L (ref 135–145)
Total Bilirubin: 0.8 mg/dL (ref 0.0–1.2)
Total Protein: 7.8 g/dL (ref 6.5–8.1)

## 2024-02-22 LAB — CBC
HCT: 40.9 % (ref 39.0–52.0)
Hemoglobin: 13.7 g/dL (ref 13.0–17.0)
MCH: 29.6 pg (ref 26.0–34.0)
MCHC: 33.5 g/dL (ref 30.0–36.0)
MCV: 88.3 fL (ref 80.0–100.0)
Platelets: 275 K/uL (ref 150–400)
RBC: 4.63 MIL/uL (ref 4.22–5.81)
RDW: 13.2 % (ref 11.5–15.5)
WBC: 7.2 K/uL (ref 4.0–10.5)
nRBC: 0 % (ref 0.0–0.2)

## 2024-02-22 LAB — LIPASE, BLOOD: Lipase: 19 U/L (ref 11–51)

## 2024-02-22 MED ORDER — METOCLOPRAMIDE HCL 5 MG/ML IJ SOLN
10.0000 mg | Freq: Once | INTRAMUSCULAR | Status: AC
  Administered 2024-02-22: 10 mg via INTRAVENOUS
  Filled 2024-02-22: qty 2

## 2024-02-22 MED ORDER — ONDANSETRON 4 MG PO TBDP: 4.0000 mg | ORAL_TABLET | Freq: Three times a day (TID) | ORAL | 0 refills | Status: AC | PRN

## 2024-02-22 MED ORDER — HYDROMORPHONE HCL 1 MG/ML IJ SOLN
1.0000 mg | Freq: Once | INTRAMUSCULAR | Status: AC
  Administered 2024-02-22: 1 mg via INTRAVENOUS
  Filled 2024-02-22: qty 1

## 2024-02-22 MED ORDER — IOHEXOL 300 MG/ML  SOLN
100.0000 mL | Freq: Once | INTRAMUSCULAR | Status: AC | PRN
  Administered 2024-02-22: 100 mL via INTRAVENOUS

## 2024-02-22 MED ORDER — DIPHENHYDRAMINE HCL 50 MG/ML IJ SOLN
12.5000 mg | Freq: Once | INTRAMUSCULAR | Status: AC
  Administered 2024-02-22: 12.5 mg via INTRAVENOUS
  Filled 2024-02-22: qty 1

## 2024-02-22 MED ORDER — LACTATED RINGERS IV BOLUS
500.0000 mL | Freq: Once | INTRAVENOUS | Status: AC
  Administered 2024-02-22: 500 mL via INTRAVENOUS

## 2024-02-22 NOTE — ED Notes (Signed)
 Pt stating he is unable to use the restroom. Told patient we only need a small amount and he said he was unable.

## 2024-02-22 NOTE — ED Provider Notes (Signed)
 [place] EMERGENCY DEPARTMENT AT [facility] Provider Note   CSN: [id number] Arrival date & time: 02/22/24  1630     Patient presents with: Abdominal Pain   Dustin Gonzalez is a 43 y.o. male.   Patient complains of abdominal pain.  Patient reports he has had nausea and vomiting.Patient patient reports that symptoms began 2 days ago after eating spicy chicken nuggets.  Patient states that he has had similar episodes in the past when he was on Mounjaro.  Patient states that he last injected his Mounjaro a week ago.  Patient complains of vomiting and abdominal cramping.  Patient reports he has not had a fever or chills he denies any diarrhea.  Patient has a past medical history of diabetes congestive heart failure and hypertension.  The history is provided by the patient.  Abdominal Pain Associated symptoms: vomiting        Prior to Admission medications  Medication Sig Start Date End Date Taking? Authorizing Provider  ondansetron  (ZOFRAN -ODT) 4 MG disintegrating tablet Take 1 tablet (4 mg total) by mouth every 8 (eight) hours as needed for nausea or vomiting. 02/22/24  Yes Flint Sonny POUR, PA-C  amLODipine  (NORVASC ) 10 MG tablet Take 1 tablet (10 mg total) by mouth daily. 04/08/21   Emelia Sluder, PA-C  cyclobenzaprine  (FLEXERIL ) 10 MG tablet Take 1 tablet (10 mg total) by mouth 2 (two) times daily as needed for muscle spasms. 03/19/21   Blue, Soijett A, PA-C  doxycycline  (VIBRAMYCIN ) 100 MG capsule Take 1 capsule (100 mg total) by mouth 2 (two) times daily. One po bid x 7 days 10/16/20   Floyd, Dan, DO  HYDROcodone -acetaminophen  (NORCO/VICODIN) 5-325 MG tablet Take 2 tablets by mouth every 4 (four) hours as needed. 03/21/21   Emelia Sluder, PA-C  metoCLOPramide  (REGLAN ) 10 MG tablet Take 1 tablet (10 mg total) by mouth every 6 (six) hours as needed for nausea or vomiting. 03/30/22   Long, Fonda MATSU, MD  naproxen  (NAPROSYN ) 500 MG tablet Take 1 tablet (500 mg total) by mouth 2 (two) times  daily. 04/08/21   Emelia Sluder, PA-C  ondansetron  (ZOFRAN ) 4 MG tablet Take 1 tablet (4 mg total) by mouth every 6 (six) hours. 03/21/21   Emelia Sluder, PA-C  promethazine  (PHENERGAN ) 25 MG suppository Place 1 suppository (25 mg total) rectally every 6 (six) hours as needed for nausea or vomiting. 03/31/22   Logan Ubaldo NOVAK, PA-C    Allergies: Shellfish allergy, Shellfish protein-containing drug products, and Iodine    Review of Systems  Gastrointestinal:  Positive for abdominal pain and vomiting.  All other systems reviewed and are negative.   Updated Vital Signs BP (!) 171/88 (BP Location: Right Arm)   Pulse 72   Temp 98.1 F (36.7 C) (Oral)   Resp 17   Ht 5' 11 (1.803 m)   Wt (!) 187.3 kg   SpO2 97%   BMI 57.60 kg/m   Physical Exam Vitals and nursing note reviewed.  Constitutional:      Appearance: He is well-developed.  HENT:     Head: Normocephalic.  Cardiovascular:     Rate and Rhythm: Normal rate.     Heart sounds: Normal heart sounds.  Pulmonary:     Effort: Pulmonary effort is normal.  Abdominal:     General: Abdomen is flat. There is no distension.     Palpations: Abdomen is soft.     Tenderness: There is generalized abdominal tenderness.  Musculoskeletal:        General:  Normal range of motion.     Cervical back: Normal range of motion.  Skin:    General: Skin is warm.  Neurological:     General: No focal deficit present.     Mental Status: He is alert and oriented to person, place, and time.  Psychiatric:        Mood and Affect: Mood normal.     (all labs ordered are listed, but only abnormal results are displayed) Labs Reviewed  COMPREHENSIVE METABOLIC PANEL WITH GFR - Abnormal; Notable for the following components:      Result Value   Potassium 3.2 (*)    Glucose, Bld 147 (*)    Creatinine, Ser 1.36 (*)    All other components within normal limits  LIPASE, BLOOD  CBC    EKG: EKG Interpretation Date/Time:  Thursday February 22 2024 19:22:54  EST Ventricular Rate:  67 PR Interval:  323 QRS Duration:  107 QT Interval:  415 QTC Calculation: 439 R Axis:   -22  Text Interpretation: Sinus rhythm Confirmed by Ruthe Cornet [phone]) on 02/22/2024 7:25:25 PM  Radiology: CT ABDOMEN PELVIS W CONTRAST Result Date: 02/22/2024 CLINICAL DATA:  Abdominal pain with nausea and vomiting 2 days. EXAM: CT ABDOMEN AND PELVIS WITH CONTRAST TECHNIQUE: Multidetector CT imaging of the abdomen and pelvis was performed using the standard protocol following bolus administration of intravenous contrast. RADIATION DOSE REDUCTION: This exam was performed according to the departmental dose-optimization program which includes automated exposure control, adjustment of the mA and/or kV according to patient size and/or use of iterative reconstruction technique. CONTRAST:  OMNIPAQUE  IOHEXOL  300 MG/ML  SOLN COMPARISON:  04/05/2022 FINDINGS: Lower chest: Mild cardiomegaly. Very small pericardial effusion. Images of the lung bases demonstrate no acute process. Hepatobiliary: Previous cholecystectomy. Mild pneumobilia unchanged. Liver is otherwise unremarkable. Pancreas: Normal. Spleen: Normal. Adrenals/Urinary Tract: Adrenal glands are normal. Kidneys are normal in size without hydronephrosis. Lower pole nonobstructing bilateral renal stones right larger than left. Ureters and bladder are normal. Stomach/Bowel: Stomach and small bowel are normal. Appendix is normal. Colon is unremarkable. Vascular/Lymphatic: Abdominal aorta is normal in caliber. Remaining vascular structures are normal. No adenopathy. Reproductive: Prostate is unremarkable. Other: No free fluid or focal inflammatory change. Musculoskeletal: No focal abnormality. IMPRESSION: 1. No acute findings in the abdomen/pelvis. 2. Nonobstructing bilateral renal stones. 3. Mild cardiomegaly with very small pericardial effusion. Electronically Signed   By: Toribio Agreste M.D.   On: 02/22/2024 18:22     Procedures    Medications Ordered in the ED  lactated ringers bolus 500 mL (0 mLs Intravenous Stopped 02/22/24 1837)  metoCLOPramide  (REGLAN ) injection 10 mg (10 mg Intravenous Given 02/22/24 1746)  diphenhydrAMINE (BENADRYL) injection 12.5 mg (12.5 mg Intravenous Given 02/22/24 1746)  HYDROmorphone  (DILAUDID ) injection 1 mg (1 mg Intravenous Given 02/22/24 1746)  iohexol  (OMNIPAQUE ) 300 MG/ML solution 100 mL (100 mLs Intravenous Contrast Given 02/22/24 1805)                                    Medical Decision Making Patient complains of vomiting and abdominal pain.  Patient reports he has not had any fever or chills symptoms began 2 days ago after eating spicy chicken nuggets.  Patient reports he has also had similar symptoms in the past because of Mounjaro.  Amount and/or Complexity of Data Reviewed Labs: ordered. Decision-making details documented in ED Course.    Details: Labs ordered reviewed and  interpreted.  CBC patient has normal hemoglobin normal white blood cell count Radiology: ordered.    Details: CT abdomen shows no acute findings.  There are bilateral renal stones nonobstructing. ECG/medicine tests: ordered.    Details: EKG normal sinus no acute findings  Risk Prescription drug management. Risk Details: Patient is given IV fluids Zofran  and Dilaudid  IV.  Patient reports relief from symptoms.  Patient is counseled on lab results.  Patient is advised to follow-up with his primary care physician for recheck.  Patient is given a prescription for Zofran  to help with nausea and vomiting.  He is advised to return if any problems        Final diagnoses:  Abdominal pain, unspecified abdominal location    ED Discharge Orders          Ordered    ondansetron  (ZOFRAN -ODT) 4 MG disintegrating tablet  Every 8 hours PRN        02/22/24 2039               Samani Deal K, PA-C 02/22/24 2149    Ruthe Cornet, DO 02/22/24 2221

## 2024-02-22 NOTE — ED Triage Notes (Signed)
 Pt POV reporting abd x2 days, endorses nausea, denies vomiting, recently increased mounjaro dose.

## 2024-02-22 NOTE — Discharge Instructions (Addendum)
 Follow-up with your physician for recheck.  Return to the emergency department if any problems.

## 2024-02-22 NOTE — ED Notes (Signed)
Pt currently unable to provide urine sample. Urinal at bedside. 

## 2024-02-22 NOTE — ED Notes (Signed)
 Reviewed AVS/discharge instructions with patient. Time allotted for and all questions answered. Patient is agreeable for d/c and escorted to ED exit by staff.

## 2024-02-22 NOTE — ED Notes (Signed)
 Patient notified of need for urine sample to complete eval/assessment. Specimen cup provided and instructions for clean catch given. Patient will notify staff when able to provide
